# Patient Record
Sex: Female | Born: 1985 | Race: Asian | Hispanic: No | Marital: Single | Smoking: Never smoker
Health system: Southern US, Community
[De-identification: ages and names within clinical notes are randomized; demographics above are authoritative.]

## PROBLEM LIST (undated history)

## (undated) DIAGNOSIS — M069 Rheumatoid arthritis, unspecified: Secondary | ICD-10-CM

## (undated) DIAGNOSIS — F32A Depression, unspecified: Secondary | ICD-10-CM

## (undated) DIAGNOSIS — R51 Headache: Secondary | ICD-10-CM

## (undated) DIAGNOSIS — Z833 Family history of diabetes mellitus: Secondary | ICD-10-CM

## (undated) DIAGNOSIS — J302 Other seasonal allergic rhinitis: Secondary | ICD-10-CM

## (undated) DIAGNOSIS — T7840XA Allergy, unspecified, initial encounter: Secondary | ICD-10-CM

## (undated) DIAGNOSIS — I10 Essential (primary) hypertension: Secondary | ICD-10-CM

## (undated) DIAGNOSIS — G4700 Insomnia, unspecified: Secondary | ICD-10-CM

## (undated) DIAGNOSIS — D689 Coagulation defect, unspecified: Secondary | ICD-10-CM

## (undated) DIAGNOSIS — Z79899 Other long term (current) drug therapy: Secondary | ICD-10-CM

## (undated) DIAGNOSIS — N946 Dysmenorrhea, unspecified: Secondary | ICD-10-CM

## (undated) DIAGNOSIS — G8929 Other chronic pain: Secondary | ICD-10-CM

## (undated) DIAGNOSIS — C801 Malignant (primary) neoplasm, unspecified: Secondary | ICD-10-CM

## (undated) DIAGNOSIS — E669 Obesity, unspecified: Secondary | ICD-10-CM

## (undated) DIAGNOSIS — K219 Gastro-esophageal reflux disease without esophagitis: Secondary | ICD-10-CM

## (undated) DIAGNOSIS — R519 Headache, unspecified: Secondary | ICD-10-CM

## (undated) DIAGNOSIS — F411 Generalized anxiety disorder: Secondary | ICD-10-CM

## (undated) HISTORY — DX: Headache: R51

## (undated) HISTORY — DX: Headache, unspecified: R51.9

## (undated) HISTORY — DX: Obesity, unspecified: E66.9

## (undated) HISTORY — DX: Insomnia, unspecified: G47.00

## (undated) HISTORY — DX: Malignant (primary) neoplasm, unspecified: C80.1

## (undated) HISTORY — DX: Other seasonal allergic rhinitis: J30.2

## (undated) HISTORY — DX: Other long term (current) drug therapy: Z79.899

## (undated) HISTORY — DX: Essential (primary) hypertension: I10

## (undated) HISTORY — DX: Gastro-esophageal reflux disease without esophagitis: K21.9

## (undated) HISTORY — DX: Allergy, unspecified, initial encounter: T78.40XA

## (undated) HISTORY — DX: Other chronic pain: G89.29

## (undated) HISTORY — PX: NO PAST SURGERIES: SHX2092

## (undated) HISTORY — DX: Rheumatoid arthritis, unspecified: M06.9

## (undated) HISTORY — DX: Depression, unspecified: F32.A

## (undated) HISTORY — DX: Generalized anxiety disorder: F41.1

## (undated) HISTORY — DX: Family history of diabetes mellitus: Z83.3

## (undated) HISTORY — DX: Coagulation defect, unspecified: D68.9

## (undated) HISTORY — DX: Dysmenorrhea, unspecified: N94.6

---

## 2010-01-31 HISTORY — PX: ESOPHAGOGASTRODUODENOSCOPY: SHX1529

## 2014-02-10 ENCOUNTER — Telehealth: Payer: Self-pay | Admitting: Medical

## 2014-02-10 NOTE — Telephone Encounter (Signed)
Pt's therapist, Lauren, with [facility] called. She stated she would like to speak to you concerning this pt before she comes in. You can reach her at [phone].

## 2014-02-10 NOTE — Telephone Encounter (Signed)
I called and left message for Lauren to call me back.

## 2014-02-12 NOTE — Telephone Encounter (Signed)
Spoke to [facility] [employer] for Cognitive and Behavior Therapy.  She is referring this person to me.  Tamara Vega has been seeing this person for several months since last summer. She is currently on Lexapro 20mg , prescribed by her PCM in [place].  PCM prior was reluctant to going higher on Lexapro or other medication for anxiety.  Tamara Vega recommended adding Klonopin 0.5mg , 1/2 TID in general, but titrating Klonopin.  Close f/u with both her and me.

## 2014-02-15 ENCOUNTER — Ambulatory Visit (INDEPENDENT_AMBULATORY_CARE_PROVIDER_SITE_OTHER): Payer: BC Managed Care – PPO | Admitting: Medical

## 2014-02-15 ENCOUNTER — Encounter: Payer: Self-pay | Admitting: Medical

## 2014-02-15 VITALS — BP 130/82 | HR 92 | Temp 98.1°F | Resp 16 | Ht 62.0 in | Wt 184.0 lb

## 2014-02-15 DIAGNOSIS — G4700 Insomnia, unspecified: Secondary | ICD-10-CM

## 2014-02-15 DIAGNOSIS — J309 Allergic rhinitis, unspecified: Secondary | ICD-10-CM

## 2014-02-15 DIAGNOSIS — J069 Acute upper respiratory infection, unspecified: Secondary | ICD-10-CM

## 2014-02-15 DIAGNOSIS — F411 Generalized anxiety disorder: Secondary | ICD-10-CM

## 2014-02-15 MED ORDER — CLONAZEPAM 0.5 MG PO TABS: ORAL_TABLET | ORAL | Status: DC

## 2014-02-15 MED ORDER — MOMETASONE FUROATE 50 MCG/ACT NA SUSP: 2.0000 | Freq: Every day | NASAL | Status: DC

## 2014-02-15 MED ORDER — ESCITALOPRAM OXALATE 10 MG PO TABS: 10.0000 mg | ORAL_TABLET | Freq: Every day | ORAL | Status: DC

## 2014-02-15 NOTE — Progress Notes (Signed)
   Subjective:   Tamara Vega is a 28 y.o. female presenting on 02/15/2014 with Insomnia  Referred here by Herby Abraham at [facility] for Cognitive and Behavioral Therapy.  Been seeing Lauren since last August 2014 for anxiety.  A lot of her anxiety is related to difficulty with sleep.  Takes Lexapro 10mg  daily, been stable on this.   Her previous PCM was prescribing the Lexapro but was resistant to any other medication changes.  Anxiety and sleep issues started late last year, came out of a bad relationship.  She struggles with insomnia which is why she was referred here.  Sleeps 2 hours per night on average.  Has trouble getting and staying asleep.  Has hx/o anxiety.  Has tried numerous things.  Has done meditation, hot showers, tea, yoga, reading in the evening.  Has tried so many things, frustrated not getting rest, exhausted.  Has tried Zquil.   The last few nights using Benadryl for head cold, slept a little better over the weekend. Has never been on any prescription sleep medication.  Lives alone, has a dog.   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  Denies drinking a lots of  Caffeine.  Drinks 1 cup of coffee daily in the morning.  Stopped soda.  Occasional drinks tea.  No caffeine after noon.  She exercises 3 times per week.   Sees Lauren once weekly for counseling.  She has sister in [place], parents in [place].    She has 4 day hx/o nasal congestion, headache, mild sore throat, LGF, mild cough, but no ear pain, chest congestion.  She does have hx/o sinus infections.  Hx/o allergies, takes Allegra daily.  Was on Nasacort prior but gave her nosebleeds.  No other complaint.  Review of Systems ROS as in subjective      Objective:     Filed Vitals:   02/15/14 1347  BP: 130/82  Pulse: 92  Temp: 98.1 F (36.7 C)  Resp: 16    General appearance: alert, no distress, WD/WN HEENT: normocephalic, sclerae anicteric, TMs pearly, nares bilat turbinate edema, clear discharge,  pharynx normal Oral cavity: MMM, no lesions Neck: supple, no lymphadenopathy, no thyromegaly, no masses Heart: RRR, normal S1, S2, no murmurs Lungs: CTA bilaterally, no wheezes, rhonchi, or rales Pulses: 2+ symmetric Psych: pleasant, good eye contact, answers questions appropriately     Assessment: Encounter Diagnoses  Name Primary?  . Insomnia Yes  . Generalized anxiety disorder   . Upper respiratory infection   . Allergic rhinitis      Plan: Insomnia - discussed prior treatment, sleep hygiene, and at request of her counselor, begin trial of Klonopin for both sleep and anxiety.   discussed risks/benefits, proper use.  Call if any reason in the meantime, otherwise f/u 2-3 wk.  Anxiety - c/t counseling, Lexapro 10mg  daily at bedtime  URI - discussed supportive care, sample of Nasonex given, discussed proper use, f/u if not improving.  Allergic rhinitis - c/t Allegra daily QHS  Tamara Vega was seen today for insomnia.  Diagnoses and associated orders for this visit:  Insomnia  Generalized anxiety disorder  Upper respiratory infection  Allergic rhinitis  Other Orders - clonazePAM (KLONOPIN) 0.5 MG tablet; 1/2 tablet BID, 1/2 - 1 tablet QHS - escitalopram (LEXAPRO) 10 MG tablet; Take 1 tablet (10 mg total) by mouth daily. - mometasone (NASONEX) 50 MCG/ACT nasal spray; Place 2 sprays into the nose daily.     Return 2-3 wk.

## 2014-02-15 NOTE — Patient Instructions (Signed)
Thank you for giving me the opportunity to serve you today.    Your diagnosis today includes: Encounter Diagnoses  Name Primary?  . Insomnia Yes  . Generalized anxiety disorder   . Upper respiratory infection   . Allergic rhinitis      Specific recommendations today include:  Begin Klonopin 0.5mg , 1/2-1 tablet at bedtime for insomnia  You may also use the Klonopin 1/2 tablet up to twice daily during the day time as needed for anxiety  Continue Lexapro 10mg  daily  Continue your routine counseling appointments with Lauren  Begin Nasonex 1 spray per nostril twice daily for nasal congestion using the sample.  As an alternate, you may use OTC Afrin at night time for 4 days or less for nasal congestion  Continue Allegra as usual  You can continue nasal saline spray or flush  Rest, drink plenty of water, and if not much improved by end of the week, let me know  Follow up: in 2-3 weeks   I have included other useful information below for your review.  Insomnia Insomnia is frequent trouble falling and/or staying asleep. Insomnia can be a long term problem or a short term problem. Both are common. Insomnia can be a short term problem when the wakefulness is related to a certain stress or worry. Long term insomnia is often related to ongoing stress during waking hours and/or poor sleeping habits. Overtime, sleep deprivation itself can make the problem worse. Every little thing feels more severe because you are overtired and your ability to cope is decreased. CAUSES   Stress, anxiety, and depression.  Poor sleeping habits.  Distractions such as TV in the bedroom.  Naps close to bedtime.  Engaging in emotionally charged conversations before bed.  Technical reading before sleep.  Alcohol and other sedatives. They may make the problem worse. They can hurt normal sleep patterns and normal dream activity.  Stimulants such as caffeine for several hours prior to bedtime.  Pain  syndromes and shortness of breath can cause insomnia.  Exercise late at night.  Changing time zones may cause sleeping problems (jet lag). It is sometimes helpful to have someone observe your sleeping patterns. They should look for periods of not breathing during the night (sleep apnea). They should also look to see how long those periods last. If you live alone or observers are uncertain, you can also be observed at a sleep clinic where your sleep patterns will be professionally monitored. Sleep apnea requires a checkup and treatment. Give your caregivers your medical history. Give your caregivers observations your family has made about your sleep.  SYMPTOMS   Not feeling rested in the morning.  Anxiety and restlessness at bedtime.  Difficulty falling and staying asleep. TREATMENT   Your caregiver may prescribe treatment for an underlying medical disorders. Your caregiver can give advice or help if you are using alcohol or other drugs for self-medication. Treatment of underlying problems will usually eliminate insomnia problems.  Medications can be prescribed for short time use. They are generally not recommended for lengthy use.  Over-the-counter sleep medicines are not recommended for lengthy use. They can be habit forming.  You can promote easier sleeping by making lifestyle changes such as:  Using relaxation techniques that help with breathing and reduce muscle tension.  Exercising earlier in the day.  Changing your diet and the time of your last meal. No night time snacks.  Establish a regular time to go to bed.  Counseling can help with stressful problems  and worry.  Soothing music and white noise may be helpful if there are background noises you cannot remove.  Stop tedious detailed work at least one hour before bedtime. HOME CARE INSTRUCTIONS   Keep a diary. Inform your caregiver about your progress. This includes any medication side effects. See your caregiver  regularly. Take note of:  Times when you are asleep.  Times when you are awake during the night.  The quality of your sleep.  How you feel the next day. This information will help your caregiver care for you.  Get out of bed if you are still awake after 15 minutes. Read or do some quiet activity. Keep the lights down. Wait until you feel sleepy and go back to bed.  Keep regular sleeping and waking hours. Avoid naps.  Exercise regularly.  Avoid distractions at bedtime. Distractions include watching television or engaging in any intense or detailed activity like attempting to balance the household checkbook.  Develop a bedtime ritual. Keep a familiar routine of bathing, brushing your teeth, climbing into bed at the same time each night, listening to soothing music. Routines increase the success of falling to sleep faster.  Use relaxation techniques. This can be using breathing and muscle tension release routines. It can also include visualizing peaceful scenes. You can also help control troubling or intruding thoughts by keeping your mind occupied with boring or repetitive thoughts like the old concept of counting sheep. You can make it more creative like imagining planting one beautiful flower after another in your backyard garden.  During your day, work to eliminate stress. When this is not possible use some of the previous suggestions to help reduce the anxiety that accompanies stressful situations. MAKE SURE YOU:   Understand these instructions.  Will watch your condition.  Will get help right away if you are not doing well or get worse. Document Released: 11/16/2000 Document Revised: 02/11/2012 Document Reviewed: 12/17/2007 [facility] Patient Information 2014 [place].

## 2014-03-15 ENCOUNTER — Encounter: Payer: Self-pay | Admitting: Medical

## 2014-03-15 ENCOUNTER — Ambulatory Visit (INDEPENDENT_AMBULATORY_CARE_PROVIDER_SITE_OTHER): Payer: BC Managed Care – PPO | Admitting: Medical

## 2014-03-15 ENCOUNTER — Ambulatory Visit: Payer: BC Managed Care – PPO | Admitting: Medical

## 2014-03-15 VITALS — BP 100/80 | HR 80 | Temp 98.2°F | Resp 16 | Wt 187.0 lb

## 2014-03-15 DIAGNOSIS — G4700 Insomnia, unspecified: Secondary | ICD-10-CM

## 2014-03-15 DIAGNOSIS — F411 Generalized anxiety disorder: Secondary | ICD-10-CM

## 2014-03-15 MED ORDER — TRAZODONE HCL 50 MG PO TABS: 25.0000 mg | ORAL_TABLET | Freq: Every evening | ORAL | Status: DC | PRN

## 2014-03-15 MED ORDER — ESCITALOPRAM OXALATE 20 MG PO TABS
20.0000 mg | ORAL_TABLET | Freq: Every day | ORAL | Status: DC
Start: 2014-03-15 — End: 2014-11-23

## 2014-03-15 NOTE — Patient Instructions (Signed)
Insomnia, Anxiety   Increase to Lexapro 20mg  daily.  At bedtime, begin Trazodone 25mg  or 1/2 tablet of the Trazodone 50mg  each night time.    For now, continue using klonopin 1/2 tablet in the morning, and 1/2-1 tablet if needed at bedtime.    Try and given the Trazodone 25mg  at least 7-10 days to see how this will work.   After 10 days, if no major improvement, you can use 50mg  Trazodone at bedtime.  After begin on this regimen 2-3 weeks, call back and give me update on sleep.

## 2014-03-15 NOTE — Progress Notes (Signed)
Subjective:   Tamara Vega is a 28 y.o. female presenting on 03/15/2014 with Follow-up  Referred here by Tamara Vega at [facility] for Cognitive and Behavioral Therapy.  Been seeing Tamara Vega since last August 2014 for anxiety.  A lot of her anxiety is related to difficulty with sleep.  Takes Lexapro 10mg  daily, been stable on this.   Anxiety and sleep issues started late last year, came out of a bad relationship.    Since last visit was taking 1/2 tablet of Klonopin in the morning and 1 tablet at bedtime.  1/2 in the morning really helps her anxiety.  Taking 1 at bedtime helps her get to sleep, has improved since last visit but still only getting 4-6 hours of sleep nightly.     She struggles with insomnia which is why she was referred here.  Prior to last visit was sleeping 2 hours per night on average.  Has trouble getting and staying asleep.  Has hx/o anxiety.  Has tried numerous things.  Has done meditation, hot showers, tea, yoga, reading in the evening.  Has tried so many things, frustrated not getting rest, exhausted.  Has tried Zquil.   Lives alone, has a dog.   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  Denies drinking a lots of  Caffeine.  Drinks 1 cup of coffee daily in the morning.  Stopped soda.  Occasional drinks tea.  No caffeine after noon.  She exercises 3 times per week.   Sees Tamara Vega once weekly for counseling.  She has sister in [place], parents in [place].    No other complaint.  Review of Systems ROS as in subjective      Objective:     Filed Vitals:   03/15/14 0828  BP: 100/80  Pulse: 80  Temp: 98.2 F (36.8 C)  Resp: 16    General appearance: alert, no distress, WD/WN Psych: pleasant, good eye contact, answers questions appropriately     Assessment: Encounter Diagnoses  Name Primary?  . Insomnia Yes  . Generalized anxiety disorder      Plan: Insomnia - discussed prior treatment, sleep hygiene. Discussed options for therapy.  She did  have improvement with Klonopin but not quite as desired.  Begin Trazodone 25mg  QHS, hold off on Klonopin QHS for now.  Discussed risks/benefits, proper use.  Call if any reason in the meantime, otherwise f/u 2-3 wk.  Anxiety - c/t counseling, we discussed her concerns, recent counseling visit with Tamara Vega, increase Lexapro 20mg  daily at bedtime.   We will have to monitor for weight gain.   Tamara Vega was seen today for follow-up.  Diagnoses and associated orders for this visit:  Insomnia  Generalized anxiety disorder  Other Orders - escitalopram (LEXAPRO) 20 MG tablet; Take 1 tablet (20 mg total) by mouth at bedtime. - traZODone (DESYREL) 50 MG tablet; Take 0.5-1 tablets (25-50 mg total) by mouth at bedtime as needed for sleep.     Return call back in 2-3 wk.

## 2014-04-29 ENCOUNTER — Other Ambulatory Visit: Payer: Self-pay | Admitting: Medical

## 2014-04-29 NOTE — Telephone Encounter (Signed)
Called Rx to pharmacy

## 2014-04-29 NOTE — Telephone Encounter (Signed)
Called Klonopin generic to pharmacy per Audelia Acton.

## 2014-04-29 NOTE — Telephone Encounter (Signed)
Is this okay to refill? 

## 2014-04-29 NOTE — Telephone Encounter (Signed)
LM to CB

## 2014-04-29 NOTE — Telephone Encounter (Signed)
pls call and see how she is doing.  How is the Trazodone working for sleep?  Is she taking it daily?  Is she doing better on the higher dose of Lexapro?  How often is she using the [place]?

## 2014-05-20 ENCOUNTER — Ambulatory Visit: Payer: BC Managed Care – PPO | Admitting: Medical

## 2014-05-24 ENCOUNTER — Ambulatory Visit (INDEPENDENT_AMBULATORY_CARE_PROVIDER_SITE_OTHER): Payer: BC Managed Care – PPO | Admitting: Medical

## 2014-05-24 ENCOUNTER — Encounter: Payer: Self-pay | Admitting: Medical

## 2014-05-24 VITALS — BP 130/80 | HR 78 | Temp 98.2°F | Resp 14 | Wt 194.0 lb

## 2014-05-24 DIAGNOSIS — G4700 Insomnia, unspecified: Secondary | ICD-10-CM

## 2014-05-24 DIAGNOSIS — F411 Generalized anxiety disorder: Secondary | ICD-10-CM

## 2014-05-24 DIAGNOSIS — R635 Abnormal weight gain: Secondary | ICD-10-CM

## 2014-05-24 MED ORDER — CITALOPRAM HYDROBROMIDE 20 MG PO TABS: 20.0000 mg | ORAL_TABLET | Freq: Every day | ORAL | Status: DC

## 2014-05-24 MED ORDER — PHENTERMINE HCL 37.5 MG PO TABS: 37.5000 mg | ORAL_TABLET | Freq: Every day | ORAL | Status: DC

## 2014-05-24 NOTE — Progress Notes (Signed)
Subjective:   Tamara Vega is a 28 y.o. female presenting on 05/24/2014 with Follow-up  Insomnia - taking Trazodone regularly with good results, relatively speaking.  Was getting 2 hours per night sleep, but now getting at least 4 hours per night of sleep.    Anxiety - Uses Clonazepam intermittently, but not frequently for anxiety/panic, not using this for sleep.  Overall has done well on lexapro, but since we increased to 20mg , she had gained 10 lb, since starting Lexapro in late 2014 by prior PCM, has gained overall 30 lb since starting Lexapro which has congributed to her anxiety.  Still seeing Herby Abraham at [facility] for Cognitive and Behavioral Therapy.  Been seeing Lauren since last August 2014 for anxiety.  A lot of her anxiety is related to difficulty with sleep.   Her sister is getting married this week, so has lots of stressors this week.   No other complaint.  Review of Systems ROS as in subjective      Objective:     Filed Vitals:   05/24/14 1044  BP: 130/80  Pulse: 78  Temp: 98.2 F (36.8 C)  Resp: 14    General appearance: alert, no distress, WD/WN Psych: pleasant, good eye contact, answers questions appropriately     Assessment: Encounter Diagnoses  Name Primary?  . Generalized anxiety disorder Yes  . Insomnia   . Weight gain      Plan: Insomnia - c/t Trazodone 50mg  QHS, c/t good sleep hygiene.  Anxiety - c/t counseling, we discussed her concerns, change from Lexapro to Citalopram due to weight gain with Lexapro.  Weight gain - after significant weight gain on lexapro, change off Lexapro.  C/t routine exercise and careful diet which she is doing.  Can begin Phentermine.  discussed risks/benefits of medication.    Bently was seen today for follow-up.  Diagnoses and associated orders for this visit:  Generalized anxiety disorder  Insomnia  Weight gain  Other Orders - citalopram (CELEXA) 20 MG tablet; Take 1 tablet (20 mg total) by mouth  daily. - phentermine (ADIPEX-P) 37.5 MG tablet; Take 1 tablet (37.5 mg total) by mouth daily before breakfast.     Return call report 1-2 wk.

## 2014-05-24 NOTE — Patient Instructions (Signed)
Recommendations:  Lets STOP Lexapro, and change to Citalopram/Celexa.   Begin Celexa/Citalopram 20mg  at bedtime tonight.  Continue Trazodone as you have been doing.  Use Clonazepam if needed.  If any side effects, problem, then call right away.  Monitor your weight closely for the next few weeks and keep me informed.  At your discretion - begin Phentermine weight loss medication.  This is an appetite suppressant taken in the morning.   Common side effects of Phentermine include insomnia and headaches, but many people do fine without side effects.    Lets plan follow up in 1 months, sooner by phone or in person if problems.   Check on insurance coverage and pricing for Qysmia weight loss medication.

## 2014-07-12 ENCOUNTER — Telehealth: Payer: Self-pay | Admitting: Internal Medicine

## 2014-07-12 NOTE — Telephone Encounter (Signed)
Pt is requesting her citalopram to be changed from 20mg  to 40mg  to [facility]

## 2014-07-13 ENCOUNTER — Other Ambulatory Visit: Payer: Self-pay | Admitting: Medical

## 2014-07-13 MED ORDER — CITALOPRAM HYDROBROMIDE 40 MG PO TABS: 40.0000 mg | ORAL_TABLET | Freq: Every day | ORAL | Status: DC

## 2014-07-18 ENCOUNTER — Other Ambulatory Visit: Payer: Self-pay | Admitting: Medical

## 2014-07-19 NOTE — Telephone Encounter (Signed)
Is this okay to refill? 

## 2014-07-19 NOTE — Telephone Encounter (Signed)
Can refill both x 1

## 2014-07-19 NOTE — Telephone Encounter (Signed)
[facility] [place] [address]. Fransisco Beau Trazodone HCI 50 mg

## 2014-07-19 NOTE — Telephone Encounter (Signed)
Called med out to pharmacy  

## 2014-08-06 ENCOUNTER — Other Ambulatory Visit: Payer: Self-pay | Admitting: Medical

## 2014-08-06 MED ORDER — CITALOPRAM HYDROBROMIDE 40 MG PO TABS: 40.0000 mg | ORAL_TABLET | Freq: Every day | ORAL | Status: DC

## 2014-08-06 NOTE — Telephone Encounter (Signed)
rx sent

## 2014-08-06 NOTE — Telephone Encounter (Signed)
I am worried that if we go up on the dose, she will get the weight gain issue again.  Is the current dose not helping as much as she would like? What is her current weight?  She may want to come in and discuss other medication options.     Let me know.

## 2014-08-06 NOTE — Telephone Encounter (Signed)
Pt states that she is doing 20mg  @ 1.5 tablets to equal her at 30mg . She states the 40mg  was to strong and then 20mg  was not working. Pt talked to her therapist and they agreed that it would be best for ehr to be at 30mg . Her current weight is 179.5. Please send in 30mg  to [facility].

## 2014-08-06 NOTE — Telephone Encounter (Signed)
Is this okay to refill? 

## 2014-10-03 DIAGNOSIS — M069 Rheumatoid arthritis, unspecified: Secondary | ICD-10-CM

## 2014-10-03 HISTORY — DX: Rheumatoid arthritis, unspecified: M06.9

## 2014-10-19 ENCOUNTER — Telehealth: Payer: Self-pay | Admitting: Internal Medicine

## 2014-10-19 NOTE — Telephone Encounter (Signed)
Refill request for phentermine 37.5mg  #[facility]

## 2014-10-19 NOTE — Telephone Encounter (Signed)
Call out 1 mo and plan f/u visit

## 2014-10-20 ENCOUNTER — Other Ambulatory Visit: Payer: Self-pay | Admitting: Family Medicine

## 2014-10-20 MED ORDER — PHENTERMINE HCL 37.5 MG PO TABS: 37.5000 mg | ORAL_TABLET | Freq: Every day | ORAL | Status: DC

## 2014-10-20 NOTE — Telephone Encounter (Signed)
I called out phentermine per Chana Bode [facility] and patient is aware to schedule a follow up visit

## 2014-11-03 ENCOUNTER — Telehealth: Payer: Self-pay | Admitting: Medical

## 2014-11-03 NOTE — Telephone Encounter (Signed)
Please call and try to find out some of her questions.  Last visit was in June so she is due for a visit though.  Please schedule f/u

## 2014-11-03 NOTE — Telephone Encounter (Signed)
Lauren would like to speak to you concerning Tamara Vega  Her # [id number] and she will available until around 4:00

## 2014-11-23 ENCOUNTER — Ambulatory Visit (INDEPENDENT_AMBULATORY_CARE_PROVIDER_SITE_OTHER): Payer: 59 | Admitting: Medical

## 2014-11-23 ENCOUNTER — Encounter: Payer: Self-pay | Admitting: Medical

## 2014-11-23 VITALS — BP 138/82 | HR 88 | Temp 98.5°F | Resp 16 | Wt 182.0 lb

## 2014-11-23 DIAGNOSIS — G4700 Insomnia, unspecified: Secondary | ICD-10-CM

## 2014-11-23 DIAGNOSIS — Z638 Other specified problems related to primary support group: Secondary | ICD-10-CM

## 2014-11-23 DIAGNOSIS — E669 Obesity, unspecified: Secondary | ICD-10-CM

## 2014-11-23 DIAGNOSIS — Z566 Other physical and mental strain related to work: Secondary | ICD-10-CM

## 2014-11-23 DIAGNOSIS — F411 Generalized anxiety disorder: Secondary | ICD-10-CM

## 2014-11-23 DIAGNOSIS — M2550 Pain in unspecified joint: Secondary | ICD-10-CM

## 2014-11-23 MED ORDER — VENLAFAXINE HCL ER 37.5 MG PO CP24: 37.5000 mg | ORAL_CAPSULE | Freq: Every day | ORAL | Status: DC

## 2014-11-23 MED ORDER — ALPRAZOLAM 0.5 MG PO TABS: 0.5000 mg | ORAL_TABLET | Freq: Every evening | ORAL | Status: DC | PRN

## 2014-11-23 MED ORDER — TRAZODONE HCL 50 MG PO TABS: 50.0000 mg | ORAL_TABLET | Freq: Every day | ORAL | Status: DC

## 2014-11-23 NOTE — Patient Instructions (Signed)
We are making a couple changes today:   For the next 7 days, start taking 1/2 tablet of Celexa daily, and after 7 days, STOP Celexa  By Friday, begin Effexor 37.5mg  XR once daily  By the middle of next week, you will be OFF Celexa, and on Effexor for anxiety  You can continue Trazodone for sleep, but try and use lower dose while we make this medication change to Effexor  We will also transition OFF Clonazepam, and instead use Xanax when needed for panic feeling, anxiety.     Xanax is shorter acting, and should work fine when needed  Call me back by next Friday around your birthday to let me know how things are doing  Call sooner if needed

## 2014-11-23 NOTE — Progress Notes (Signed)
Subjective: Here for follow-up on medications. She was originally referred here by Herby Abraham at [facility] for Cognitive and Behavioral Therapy.  In recent months she has had an increase in anxiety, medications not seeming to work as well. 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  Lately she has been more withdrawn, doesn't care about hanging out with friends or doing other social things. She is single, has a dog, and at times her employer probably takes advantage of the fact that she is single, giving her more work.  There are also unreasonable demands on her from her parents. She is the last one of her siblings that is not married.  Her father is a Teacher, music, her sister is a Software engineer, her brother is a Psychologist, sport and exercise.  Her mother raised the family.  Lately stress is exhibited with hair falling out too.  Originally she came to me with insomnia issues and overall has done pretty well with trazodone. Gets about 6 hours of sleep nightly compared to the initial 4 she was getting  She had lost back down to her usual weight after initial gain weight with the Celexa  She is still taking Celexa 40 mg daily, was on Lexapro prior. On average takes 3-4 Klonopin per month.  Takes 1/4-1/2 half a trazodone tablet nightly  She still occasionally sees her prior primary care provider back in [place] in [place]. She saw him several weeks ago with complaints of bilateral wrist swelling and pain, ankle pain, knee pain, and carpal tunnel like symptoms but apparently had abnormal rheumatoid screening labs. She has a referral and will be seeing rheumatology, Dr. Lenna Gilford in January  ROS as in subjective  Objective: BP 138/82 mmHg  Pulse 88  Temp(Src) 98.5 F (36.9 C) (Oral)  Resp 16  Wt 182 lb (82.555 kg)  Gen: wd, wn, nad Psych: pleasant, answers questions appropriately, good eye contact  Assessment:  Encounter Diagnoses  Name  Primary?  . Generalized anxiety disorder Yes  . Insomnia   . Work-related stress   . Stress due to family tension   . Obesity   . Polyarthralgia    Plan: We discussed her concerns and recent issues. She will continue counseling with Lauren. We will make a change off Celexa home to Effexor as discussed. Will replace clonazepam with Xanax for shorter acting medication.  She will continue trazodone which she has done fairly well with.   She will call within a week let me know how the transition is going. We discussed setting boundaries both with personal family life as well as work life.  Obesity-she continues to work on diet and exercise.  Polyarthralgia- she will have rheumatology copy me on the labs and evaluation  Patient Instructions  We are making a couple changes today:   For the next 7 days, start taking 1/2 tablet of Celexa daily, and after 7 days, STOP Celexa  By Friday, begin Effexor 37.5mg  XR once daily  By the middle of next week, you will be OFF Celexa, and on Effexor for anxiety  You can continue Trazodone for sleep, but try and use lower dose while we make this medication change to Effexor  We will also transition OFF Clonazepam, and instead use Xanax when needed for panic feeling, anxiety.     Xanax is shorter acting, and should work fine when needed  Call me back by next Friday around your birthday to let me  know how things are doing  Call sooner if needed

## 2014-12-17 ENCOUNTER — Other Ambulatory Visit: Payer: Self-pay | Admitting: Medical

## 2014-12-17 ENCOUNTER — Telehealth: Payer: Self-pay | Admitting: Medical

## 2014-12-17 MED ORDER — VENLAFAXINE HCL ER 75 MG PO CP24: 75.0000 mg | ORAL_CAPSULE | Freq: Every day | ORAL | Status: DC

## 2014-12-17 NOTE — Telephone Encounter (Signed)
i sent Effexor 75mg  XR daily.  This is an increased dose.   Glad to hear it is helping.  F/u 49mo

## 2014-12-17 NOTE — Telephone Encounter (Signed)
Pt called with an update for Tamara Vega. She switched to Effexor after 10 days anxiety has improved a lot. The patient and her therapist think that the Effexor dosage does need to be increased to further help.

## 2014-12-17 NOTE — Telephone Encounter (Signed)
Left detailed message on pt vm.  

## 2014-12-22 ENCOUNTER — Telehealth: Payer: Self-pay | Admitting: Medical

## 2014-12-22 NOTE — Telephone Encounter (Signed)
Call out phentermine and f/u mid February on both Effexor and phentermine

## 2014-12-23 ENCOUNTER — Other Ambulatory Visit: Payer: Self-pay

## 2014-12-23 NOTE — Telephone Encounter (Signed)
Called Phentermine 37.5mg  #30 1 po q am, and Effexor XR 75mg  #30, 1 po q am to [facility] per Audelia Acton.

## 2014-12-23 NOTE — Telephone Encounter (Signed)
Done

## 2015-01-20 ENCOUNTER — Encounter: Payer: Self-pay | Admitting: *Deleted

## 2015-01-21 ENCOUNTER — Ambulatory Visit (INDEPENDENT_AMBULATORY_CARE_PROVIDER_SITE_OTHER): Payer: 59 | Admitting: Neurology

## 2015-01-21 ENCOUNTER — Other Ambulatory Visit: Payer: Self-pay | Admitting: *Deleted

## 2015-01-21 DIAGNOSIS — R208 Other disturbances of skin sensation: Secondary | ICD-10-CM

## 2015-01-21 DIAGNOSIS — R200 Anesthesia of skin: Secondary | ICD-10-CM

## 2015-01-21 NOTE — Procedures (Signed)
[facility] Neurology  [facility], [place]  [place], [place] [address] Tel: [phone] Fax:  [phone] Test Date:  01/21/2015  Patient: Tamara Vega DOB: 03-16-1986 Physician: Narda Amber, DO  Sex: Female Height: 5' 2.75" Ref Phys: Gavin Pound  ID#: [id number] Temp: 37.3C Technician: Laureen Ochs R. NCS T.   Patient Complaints: Patient is a 29 year old female here for evaluation of numbness, tingling and pain in both hands left worse than right.  NCV & EMG Findings: Extensive electrodiagnostic testing of the left upper extremity and additional studies of the right shows: 1. Left median sensory response shows prolonged latency with preserved amplitude. The remaining sensory studies including the right median, right palmar, bilateral radial and ulnar sensory nerves are within normal limits. 2. Bilateral median and ulnar motor responses are within normal limits. 3. There is no evidence of active or chronic motor axon loss changes affecting any of the tested muscles.   Impression: 1. Left median neuropathy, at or distal to the wrist, consistent with the clinical diagnosis of carpal tunnel syndrome; mild in degree electrically. 2. There is no evidence of a cervical radiculopathy affecting the upper extremities.  ___________________________ Narda Amber, DO    Nerve Conduction Studies Anti Sensory Summary Table   Site NR Peak (ms) Norm Peak (ms) P-T Amp (V) Norm P-T Amp  Left Median Anti Sensory (2nd Digit)  Wrist    3.6 <3.4 42.1 >20  Right Median Anti Sensory (2nd Digit)  38C  Wrist    3.1 <3.4 50.9 >20  Left Radial Anti Sensory (Base 1st Digit)  Wrist    2.1 <2.7 39.3 >18  Right Radial Anti Sensory (Base 1st Digit)  38C  Wrist    1.9 <2.7 39.0 >18  Left Ulnar Anti Sensory (5th Digit)  Wrist    2.7 <3.1 39.3 >12  Right Ulnar Anti Sensory (5th Digit)  38C  Wrist    2.6 <3.1 31.6 >12   Motor Summary Table   Site NR Onset (ms) Norm Onset (ms) O-P Amp (mV)  Norm O-P Amp Site1 Site2 Delta-0 (ms) Dist (cm) Vel (m/s) Norm Vel (m/s)  Left Median Motor (Abd Poll Brev)  Wrist    3.5 <3.9 11.7 >6 Elbow Wrist 4.6 25.0 54 >50  Elbow    8.1  11.7         Right Median Motor (Abd Poll Brev)  38C  Wrist    2.5 <3.9 9.9 >6 Elbow Wrist 4.3 26.0 60 >50  Elbow    6.8  9.6         Left Ulnar Motor (Abd Dig Minimi)  Wrist    1.6 <3.1 9.6 >7 B Elbow Wrist 3.6 21.0 58 >50  B Elbow    5.2  9.1  A Elbow B Elbow 1.7 10.0 59 >50  A Elbow    6.9  8.7         Right Ulnar Motor (Abd Dig Minimi)  38C  Wrist    1.7 <3.1 8.2 >7 B Elbow Wrist 3.5 22.0 63 >50  B Elbow    5.2  8.2  A Elbow B Elbow 1.8 10.0 56 >50  A Elbow    7.0  8.2          Comparison Summary Table   Site NR Peak (ms) Norm Peak (ms) P-T Amp (V) Site1 Site2 Delta-P (ms) Norm Delta (ms)  Right Median/Ulnar Palm Comparison (Wrist - 8cm)  38C  Median Palm    2.0 <  2.2 63.0 Median Palm Ulnar Palm 0.2   Ulnar Palm    1.8 <2.2 27.3       EMG   Side Muscle Ins Act Fibs Psw Fasc Number Recrt Dur Dur. Amp Amp. Poly Poly. Comment  Right 1stDorInt Nml Nml Nml Nml Nml Nml Nml Nml Nml Nml Nml Nml N/A  Right Ext Indicis Nml Nml Nml Nml Nml Nml Nml Nml Nml Nml Nml Nml N/A  Right PronatorTeres Nml Nml Nml Nml Nml Nml Nml Nml Nml Nml Nml Nml N/A  Right Biceps Nml Nml Nml Nml Nml Nml Nml Nml Nml Nml Nml Nml N/A  Right Triceps Nml Nml Nml Nml Nml Nml Nml Nml Nml Nml Nml Nml N/A  Right Deltoid Nml Nml Nml Nml Nml Nml Nml Nml Nml Nml Nml Nml N/A  Left 1stDorInt Nml Nml Nml Nml Nml Nml Nml Nml Nml Nml Nml Nml N/A  Left Ext Indicis Nml Nml Nml Nml Nml Nml Nml Nml Nml Nml Nml Nml N/A  Left PronatorTeres Nml Nml Nml Nml Nml Nml Nml Nml Nml Nml Nml Nml N/A  Left Abd Poll Brev Nml Nml Nml Nml Nml Nml Nml Nml Nml Nml Nml Nml N/A  Left Biceps Nml Nml Nml Nml Nml Nml Nml Nml Nml Nml Nml Nml N/A  Left Deltoid Nml Nml Nml Nml Nml Nml Nml Nml Nml Nml Nml Nml N/A      Waveforms:

## 2015-01-26 ENCOUNTER — Encounter: Payer: Self-pay | Admitting: Medical

## 2015-01-26 ENCOUNTER — Ambulatory Visit (INDEPENDENT_AMBULATORY_CARE_PROVIDER_SITE_OTHER): Payer: 59 | Admitting: Medical

## 2015-01-26 VITALS — BP 122/90 | HR 105 | Temp 98.1°F | Resp 14 | Wt 179.0 lb

## 2015-01-26 DIAGNOSIS — R509 Fever, unspecified: Secondary | ICD-10-CM

## 2015-01-26 DIAGNOSIS — R52 Pain, unspecified: Secondary | ICD-10-CM

## 2015-01-26 DIAGNOSIS — R112 Nausea with vomiting, unspecified: Secondary | ICD-10-CM

## 2015-01-26 LAB — POC INFLUENZA A&B (BINAX/QUICKVUE)
INFLUENZA B, POC: NEGATIVE
Influenza A, POC: NEGATIVE

## 2015-01-26 MED ORDER — PROMETHAZINE HCL 25 MG PO TABS
25.0000 mg | ORAL_TABLET | Freq: Three times a day (TID) | ORAL | Status: DC | PRN
Start: 2015-01-26 — End: 2015-11-25

## 2015-01-26 NOTE — Progress Notes (Signed)
Subjective:  Tamara Vega is a 29 y.o. female who presents for illness.  She is accompanied by her mother. Her symptoms started abruptly 3 days ago with nausea, vomiting, fever, chills, sweats, weak, lightheaded, body aches, cough, stuffy nose, sore throat. She had nausea and vomiting all day Monday, Zofran eventually help that evening. She notes some low back pain some crampy belly pain. No urinary symptoms no rash. She has continued with the symptoms daily. There are 5 people out at her office sick. She did get a flu shot this year. Of note she had EMG testing last week for carpal tunnel symptoms. She is seeing rheumatology for possible rheumatoid arthritis but no recent change in her joint symptoms.  no other aggravating or relieving factors.  No other c/o.  The following portions of the patient's history were reviewed and updated as appropriate: allergies, current medications, past medical history, past social history and problem list.  ROS as in subjective   Past Medical History  Diagnosis Date  . Seasonal allergies     Objective: BP 122/90 mmHg  Pulse 105  Temp(Src) 98.1 F (36.7 C) (Oral)  Resp 14  Wt 179 lb (81.194 kg)  SpO2 99%  General: Ill-appearing, well-developed, well-nourished Skin: Hot, dry Oral: MMM, no lesions HEENT: Nose inflamed and congested, clear conjunctiva, TMs pearly, no sinus tenderness, pharynx with erythema, no exudates Neck: Supple, nontender, shotty cervical adenopathy Heart: Regular rate and rhythm, normal S1, S2, no murmurs Lungs: Clear to auscultation bilaterally, no wheezes, rales, rhonchi Abdomen: generalized mild tenderness, non distended, no mass, no organomegaly Back: mild low back tenderness in general Extremities: Mild generalized tenderness   Assessment and Plan: Encounter Diagnoses  Name Primary?  . Fever and chills Yes  . Nausea and vomiting, vomiting of unspecified type   . Body aches    Offered labs, chest xray to help evaluate.   She decline additional testing.  Clinically given symptoms, recent numerous flu cases in the community and sick contacts at work, despite negative flu test, her symptoms are suggestive of viral syndrome/flu like illness.  Discussed diagnosis of influenza.   Discussed supportive care including rest, hydration, OTC Tylenol or NSAID for fever, aches, and malaise.  Discussed period of contagion, self quarantine at home away from others to avoid spread of disease, discussed means of transmission, and possible complications including pneumonia.  Begin Promethazine for nausea if zofran not helping.   If worse or not improving within the next 4-5 days, then call or return.  Gave note for work.

## 2015-01-26 NOTE — Patient Instructions (Signed)
You symptoms suggest flu like illness  Recommendations:  Hydrate well throughout the day, water, gingerale, soup broth, Gatorade, ice chips  Bland foods/small portions such as crackers, rice, soup, jello, applesauce, bananas, etc  You may use Promethazine for nausea, 1/2- 1 tablet every 6 hours  For mild nausea you can use Zofran instead  For fever, aches, chills, you can use liquid OTC Ibuprofen, up to 600mg  every 6 hours  If worse in the next 48 hours such as wheezing, shortness of breath, worse symptoms, then recheck, call/return  Influenza, Adult Influenza ("the flu") is a viral infection of the respiratory tract. It causes chills, fever, cough, headache, body aches, and sore throat. Influenza in general will make you feel sicker than when you have a common cold. Symptoms of the illness typically last a few days. Cough and fatigue may continue for as long as 7 to 10 days. Influenza is highly contagious. It spreads easily to others in the droplets from coughs and sneezes. People frequently become infected by touching something that was recently contaminated with the virus and then touch their mouth, nose or eyes. This infection is caused by a virus. Symptoms will not be reduced or improved by taking an antibiotic. Antibiotics are medications that kill bacteria, not viruses. DIAGNOSIS  Diagnosis of influenza is often made based on the history and physical examination as well as the presence of influenza reports occurring in your community. Testing can be done if the diagnosis is not certain. TREATMENT  Since influenza is caused by a virus, antibiotics are not helpful. Your caregiver may prescribe antiviral medicines to shorten the illness and lessen the severity. Your caregiver may also recommend influenza vaccination and/or antiviral medicines for your family members in order to prevent the spread of influenza to them. HOME CARE INSTRUCTIONS  DO NOT GIVE ASPIRIN TO PERSONS WITH INFLUENZA  WHO ARE UNDER 38 YEARS OF AGE. This could lead to brain and liver damage (Reye's syndrome). Read the label on over-the-counter medicines.   Stay home from work or school if at all possible until most of your symptoms are gone.   Only take over-the-counter or prescription medicines for pain, discomfort, or fever as directed by your caregiver.   Use a cool mist humidifier to increase air moisture. This will make breathing easier.   Rest until your temperature is nearly normal: 98.6 F (37 C). This usually takes 3 to 4 days. Be sure you get plenty of rest.   Drink at least eight, eight-ounce glasses of fluids per day. Fluids include water, juice, broth, gelatin, or lemonade.   Cover your mouth and nose when coughing or sneezing and wash your hands often to prevent the spread of this virus to other persons.  PREVENTION  Annual influenza vaccination (flu shots) is the best way to avoid getting influenza. An annual flu shot is now routinely recommended for all adults in the [place] IF:   You develop shortness of breath while resting.   You have a deep cough with production of mucous or chest pain.   You develop nausea (feeling sick to your stomach), vomiting, or diarrhea.  SEEK IMMEDIATE MEDICAL CARE IF:   You have difficulty breathing, become short of breath, or your skin or nails turn bluish.   You develop severe neck pain or stiffness.   You develop a severe headache, facial pain, or earache.   You have a fever.   You develop nausea or vomiting that cannot be controlled.  Document  Released: 11/16/2000 Document Revised: 08/01/2011 Document Reviewed: 09/21/2009 [facility] Patient Information 2012 [place], [place].

## 2015-02-04 ENCOUNTER — Ambulatory Visit: Payer: Self-pay | Admitting: Neurology

## 2015-02-10 ENCOUNTER — Other Ambulatory Visit: Payer: Self-pay | Admitting: Medical

## 2015-02-10 ENCOUNTER — Telehealth: Payer: Self-pay | Admitting: Medical

## 2015-02-10 MED ORDER — DULOXETINE HCL 30 MG PO CPEP: 30.0000 mg | ORAL_CAPSULE | Freq: Every day | ORAL | Status: DC

## 2015-02-10 NOTE — Telephone Encounter (Signed)
I spoke to Tamara Vega her counselor who wanted to change her to Cymbalta given weight gain with Effexor.  Effexor hasn't helped all that much.    Have her stop Effexor and begin Cymbalta once daily.  Switch directly to this.   Have her f/u in 22mo here to recheck on weight, medication, sooner prn.

## 2015-02-10 NOTE — Telephone Encounter (Signed)
LM to CB [facility] 

## 2015-02-11 NOTE — Telephone Encounter (Signed)
LMOM TO CB. CLS 

## 2015-02-14 ENCOUNTER — Other Ambulatory Visit: Payer: Self-pay | Admitting: Medical

## 2015-02-14 MED ORDER — LEVOMILNACIPRAN HCL ER 40 MG PO CP24: 1.0000 | ORAL_CAPSULE | Freq: Every day | ORAL | Status: DC

## 2015-02-14 NOTE — Telephone Encounter (Signed)
LMOM TO CB. CLS 

## 2015-02-14 NOTE — Telephone Encounter (Signed)
i called out Fitzema instead of Cymbalta since that one was going to cost so much.  If this too expensive, have her check insurance coverage for copay.  Give coupon cards if we have them.  If this is cheaper, f/u 3-4 wk.

## 2015-02-14 NOTE — Telephone Encounter (Signed)
I called and I spoke with the pharmacy and she said that the generic of Cymbalta is costing her $90.99. Please, advise on what to do next?

## 2015-02-15 NOTE — Telephone Encounter (Signed)
I left a message on the patients voicemail and ask her to call back to the office

## 2015-02-17 NOTE — Telephone Encounter (Signed)
LM to CB [facility] 

## 2015-02-18 ENCOUNTER — Encounter: Payer: Self-pay | Admitting: Neurology

## 2015-02-18 NOTE — Telephone Encounter (Signed)
I called the patient on her work number per the message that was left on voicemail. Patient was upset and not very happy. Patient states that she is not happy with this practice and nothing against Dorothea Ogle PA. She states that she is tired of calling up her and being placed on hold. She is tired of calling up here and being ask if she is a patient  And she states " if I wasn't a patient there why would I call. She also said that she has told the people up front to mark it in her chart to be called on her work phone number. There is no  documentation in the chart marking her work phone number. She states that she is not going to take medication that is going to cost her $99 dollars without anybody talking to her first. I apologized to the patient for any inconvenience we may have caused her. I told her that I would send a message to [employer] PA. Patient states that there is nothing that we could do right now for her. She states that she is going to go see her therapists on Monday and talk with her and that she was going to get her records and transfer from this practice. She states that she didn't want any medication at this time. She would speak to her therapists on Monday. Please, advise what to do next

## 2015-02-22 ENCOUNTER — Telehealth: Payer: Self-pay | Admitting: Medical

## 2015-02-22 NOTE — Telephone Encounter (Signed)
P.A. Terie Purser approved til 02/22/16, called pharmacy and went thru ins for $99.10.  Printed and activated discount card and pharmacy reran and cost is $0 for first month and $around $20 a month for year.  Tamara Vega due to recent circumstances do you want to call pt and let pt know about this?

## 2015-02-23 NOTE — Telephone Encounter (Signed)
i called today and left message to call me personally.

## 2015-03-02 ENCOUNTER — Other Ambulatory Visit: Payer: Self-pay | Admitting: Medical

## 2015-03-09 NOTE — Telephone Encounter (Signed)
Tamara Vega, please try and call this patient at work.  I had called and left message to speak with her personally, but she hasn't called back.  She apparently was upset with out office, wanted to leave our practice.  I feel like we have been very accommodating to her and we wants to nurture this relationship.  She was frustrated about some of the phone calls she has had with our office, but because of the number of patient we see, the number of new patients, and for a variety of reasons, certain questions have to be asked when a patient calls here.  I have reiterated to the front office who she is, her name, and to get familiar with her name so she isn't frustrated on the phone when calling in to speak with nurse or me.  Further, when we last spoke to her by phone, she was upset that we had prescribed a mediation without discussing it with her.  That was the whole reason for the call to go over this recommendation in conjunction with her counselor and saving her an office visit.   By all means, we would prefer to go over these changes in person, but this was handled this way for her convenience.     If she is not coming back, then just document this, but we are making attempts with this call to preserve this relationship.

## 2015-03-10 NOTE — Telephone Encounter (Signed)
Tamara Vega, patient says she will not be returning to this office.

## 2015-03-10 NOTE — Telephone Encounter (Signed)
Left message for patient to call back to speak with you or me.

## 2015-04-24 LAB — HM PAP SMEAR: HM Pap smear: NEGATIVE

## 2015-06-10 ENCOUNTER — Telehealth: Payer: Self-pay | Admitting: Internal Medicine

## 2015-06-10 ENCOUNTER — Other Ambulatory Visit: Payer: Self-pay | Admitting: Medical

## 2015-06-10 NOTE — Telephone Encounter (Signed)
Pt called stating her therapist lauren Buena Irish wanted her to go back on Klonopin. [facility] [place] [facility] [address]

## 2015-06-10 NOTE — Telephone Encounter (Signed)
I can't call out any medications.  She left the practice, had went back to her primary care provider or other physician she had been seeing.     FYI - It was my understanding that she left the practice, unhappy with [place], and had re-established with her primary care she had been seeing before [place].

## 2015-06-13 NOTE — Telephone Encounter (Signed)
Will need appt before we can restart medication, discuss.

## 2015-06-13 NOTE — Telephone Encounter (Signed)
Pt states she never went back to her pcp. She had a bad experience here but spoke with her therapist and agreed to try it again. Pt made an appt to talk about med on wednesday

## 2015-06-13 NOTE — Telephone Encounter (Signed)
Left message for pt to call me back 

## 2015-06-15 ENCOUNTER — Encounter: Payer: Self-pay | Admitting: Medical

## 2015-06-15 ENCOUNTER — Ambulatory Visit (INDEPENDENT_AMBULATORY_CARE_PROVIDER_SITE_OTHER): Payer: 59 | Admitting: Medical

## 2015-06-15 VITALS — BP 120/80 | HR 110 | Resp 16 | Wt 195.0 lb

## 2015-06-15 DIAGNOSIS — F410 Panic disorder [episodic paroxysmal anxiety] without agoraphobia: Secondary | ICD-10-CM

## 2015-06-15 DIAGNOSIS — G4700 Insomnia, unspecified: Secondary | ICD-10-CM | POA: Diagnosis not present

## 2015-06-15 DIAGNOSIS — F411 Generalized anxiety disorder: Secondary | ICD-10-CM | POA: Diagnosis not present

## 2015-06-15 MED ORDER — VENLAFAXINE HCL ER 37.5 MG PO CP24: 37.5000 mg | ORAL_CAPSULE | Freq: Every day | ORAL | Status: DC

## 2015-06-15 MED ORDER — ALPRAZOLAM 0.5 MG PO TABS: 0.5000 mg | ORAL_TABLET | Freq: Every evening | ORAL | Status: DC | PRN

## 2015-06-15 MED ORDER — TRAZODONE HCL 50 MG PO TABS: ORAL_TABLET | ORAL | Status: DC

## 2015-06-15 NOTE — Patient Instructions (Signed)
Recommendations:  Begin back on Effexor 37.5mg  daily  After 3-4 weeks we can consider increasing back to 75mg  daily which is what you were taking prior  Begin back on Trazodone sleep medications, 1/2 tablet at bedtime initially.  After a week or 2, you can go to 1 whole tablet nightly if needed  Use Xanax as needed for panic attack up to twice daily if needed   Lets follow up within 4-6 weeks

## 2015-06-15 NOTE — Progress Notes (Signed)
Subjective: Here for follow-up on medications. She was originally referred here by Herby Abraham at [facility] for Cognitive and Behavioral Therapy.  After her last visit in December, she ended up leaving our practice for a while, voicing unhappiness over a phone call here and getting a call at her employer by [place] despite that she would not return or answer any other phone calls.  Nevertheless, she is back and I did speak with Herby Abraham yesterday about her, and about starting back on Effexor XR capsules .  Hx/o anxiety - since last visit was doing ok until May then got worse again.   Lots of issue with anxiety.   Gets physically nauseated and shaky in the mornings, even the thought of going to work causes a lot of anxiety.  Lauren did tell her to use the clonazepam she has left over if she is having that bad of panic episode prior to going to work. Yesterday felt panicky, shaky, sweaty, didn't want to be around anyone.  The last time she was on daily medication was March.    Her stressors include work and family stress. However, back in May she did have a talk to her parents and sister about their cultural concerns and some of their disapproval with some things she was doing, and she finally told them to back off and give her space that she was an adult and can make decisions for herself .  This helped, and their relationship has improved.   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   Feels disrespect daily, was called "blacky" due to her tan after coming back from the beach, has had some other unprofessional comments towards her from co-workers that she feels is not appropriate particularly in a legal office, feels like she doesn't have room for career advancement and personal growth at this office.   These all add to the daily stress.    Insomnia - is out of trazodone, but had done well on this.    Sees rheumatology, Dr. Lenna Gilford, currently on  Plaquenil and Diclofenac.  ROS as in subjective  Objective: BP 120/80 mmHg  Pulse 110  Resp 16  Wt 195 lb (88.451 kg)   Wt Readings from Last 3 Encounters:  06/15/15 195 lb (88.451 kg)  01/26/15 179 lb (81.194 kg)  11/23/14 182 lb (82.555 kg)   Gen: wd, wn, nad Psych: pleasant, answers questions appropriately, good eye contact   Assessment: Encounter Diagnoses  Name Primary?  . Generalized anxiety disorder Yes  . Panic disorder   . Insomnia      Plan: We discussed her concerns and recent issues. She will continue counseling with Lauren.  Begin back on Effexor low dose, and plan to move to 75mg  in a month or so.   Can use xanax prn, restart trazodone for sleep.   C/t coping skills that she has learned from [facility].   Recheck 4-6 wk.   Patient Instructions  Recommendations:  Begin back on Effexor 37.5mg  daily  After 3-4 weeks we can consider increasing back to 75mg  daily which is what you were taking prior  Begin back on Trazodone sleep medications, 1/2 tablet at bedtime initially.  After a week or 2, you can go to 1 whole tablet nightly if needed  Use Xanax as needed for panic attack up to twice daily if needed   Lets follow up within 4-6 weeks

## 2015-06-28 ENCOUNTER — Telehealth: Payer: Self-pay | Admitting: Internal Medicine

## 2015-06-28 NOTE — Telephone Encounter (Signed)
Herby Abraham called wanting to know if you could up pt effexor to 75mg . Please advise or call lauren to speak with her @ [phone]

## 2015-06-29 ENCOUNTER — Other Ambulatory Visit: Payer: Self-pay | Admitting: Medical

## 2015-06-29 MED ORDER — VENLAFAXINE HCL ER 75 MG PO CP24: 75.0000 mg | ORAL_CAPSULE | Freq: Every day | ORAL | Status: DC

## 2015-06-29 NOTE — Telephone Encounter (Signed)
Called and left detailed message for Herby Abraham and also left a detailed on pt cell phone with the info about med and to follow-up here in 2-3 weeks

## 2015-06-29 NOTE — Telephone Encounter (Signed)
Let Ander Purpura (counselor) and patient Tamara Vega know that she can take 2 of her 37.5 mg daily until she runs out.  I did sent the 75mg  to the pharmacy to change to when she runs out.  Have her f/u here in 2-3 wk on med check.

## 2015-07-30 ENCOUNTER — Other Ambulatory Visit: Payer: Self-pay | Admitting: Medical

## 2015-08-01 ENCOUNTER — Telehealth: Payer: Self-pay | Admitting: Medical

## 2015-08-01 NOTE — Telephone Encounter (Signed)
Pt's therapist, Allene Dillon, called requesting to speak to [facility].Loren wants to know if Audelia Acton will increase Effexor to 150mg 

## 2015-08-03 ENCOUNTER — Other Ambulatory Visit: Payer: Self-pay | Admitting: Medical

## 2015-08-03 MED ORDER — VENLAFAXINE HCL ER 150 MG PO CP24: 150.0000 mg | ORAL_CAPSULE | Freq: Every day | ORAL | Status: DC

## 2015-08-03 NOTE — Telephone Encounter (Signed)
Please call Lauren Atkinson/therapist [facility] for Cognitive Behavioral Therapy) back, let her know that I sent the increased dose to pharmacy.   She can double up on the 75mg  dose until she runs out.   I can get on the phone after you let her know.

## 2015-08-04 NOTE — Telephone Encounter (Signed)
Left message for Lauren and Amarisa.

## 2015-09-14 ENCOUNTER — Other Ambulatory Visit: Payer: Self-pay | Admitting: Medical

## 2015-09-14 NOTE — Telephone Encounter (Signed)
Is this okay to refill? 

## 2015-09-14 NOTE — Telephone Encounter (Signed)
Please get her back for f/u appt (54mo f/u).  Trazodone sent/refill

## 2015-09-24 ENCOUNTER — Other Ambulatory Visit: Payer: Self-pay | Admitting: Medical

## 2015-09-26 NOTE — Telephone Encounter (Signed)
Is this ok to refill?  

## 2015-10-14 ENCOUNTER — Ambulatory Visit (INDEPENDENT_AMBULATORY_CARE_PROVIDER_SITE_OTHER): Payer: 59 | Admitting: Family Medicine

## 2015-10-14 VITALS — BP 120/88 | HR 111 | Temp 98.7°F | Ht 63.0 in | Wt 195.0 lb

## 2015-10-14 DIAGNOSIS — J209 Acute bronchitis, unspecified: Secondary | ICD-10-CM

## 2015-10-14 MED ORDER — AZITHROMYCIN 500 MG PO TABS: 500.0000 mg | ORAL_TABLET | Freq: Every day | ORAL | Status: DC

## 2015-10-14 NOTE — Progress Notes (Signed)
   Subjective:    Patient ID: Tamara Vega, female    DOB: 08/24/1986, 29 y.o.   MRN: [id number]  HPI  she complains of a five-day history that started with cough and slight sore throat but no fever chills or earache. Several days later she developed fever chills, myalgia, increased cough, fatigue and dizziness. She states her temperature was 101.8. She does not smoke.   Review of Systems     Objective:   Physical Exam Alert and in no distress. Tympanic membranes and canals are normal. Pharyngeal area is normal. Neck is supple without adenopathy or thyromegaly. Cardiac exam shows a regular sinus rhythm without murmurs or gallops. Lungs are clear to auscultation.        Assessment & Plan:  Acute bronchitis, unspecified organism - Plan: azithromycin (ZITHROMAX) 500 MG tablet  her symptoms do sound as if she has a bacterial infection. We'll place her on azithromycin and she will call if continued difficulty.

## 2015-10-30 ENCOUNTER — Other Ambulatory Visit: Payer: Self-pay | Admitting: Medical

## 2015-10-31 NOTE — Telephone Encounter (Signed)
Is this ok to refill?  

## 2015-11-03 ENCOUNTER — Other Ambulatory Visit: Payer: Self-pay | Admitting: Family Medicine

## 2015-11-03 NOTE — Telephone Encounter (Signed)
Is this okay?

## 2015-11-13 ENCOUNTER — Other Ambulatory Visit: Payer: Self-pay | Admitting: Medical

## 2015-11-14 NOTE — Telephone Encounter (Signed)
Is this ok to refill?  

## 2015-11-21 ENCOUNTER — Other Ambulatory Visit: Payer: Self-pay | Admitting: Family Medicine

## 2015-11-21 ENCOUNTER — Other Ambulatory Visit: Payer: Self-pay | Admitting: Medical

## 2015-11-21 NOTE — Telephone Encounter (Signed)
Is this ok to refill?  

## 2015-11-21 NOTE — Telephone Encounter (Signed)
Is this okay to call in? 

## 2015-11-21 NOTE — Telephone Encounter (Signed)
Okay to renew

## 2015-11-22 ENCOUNTER — Telehealth: Payer: Self-pay | Admitting: Medical

## 2015-11-22 NOTE — Telephone Encounter (Signed)
LMTCB

## 2015-11-22 NOTE — Telephone Encounter (Signed)
Med check appt is made for friday

## 2015-11-22 NOTE — Telephone Encounter (Signed)
Last visit was 06/2015.  She is due back for f/u on meds.  Please set her up f/u, or if last physical > 1year ago, can set up for CPX.

## 2015-11-25 ENCOUNTER — Encounter: Payer: Self-pay | Admitting: Medical

## 2015-11-25 ENCOUNTER — Ambulatory Visit (INDEPENDENT_AMBULATORY_CARE_PROVIDER_SITE_OTHER): Payer: 59 | Admitting: Medical

## 2015-11-25 VITALS — BP 110/80 | HR 92 | Wt 195.0 lb

## 2015-11-25 DIAGNOSIS — Z566 Other physical and mental strain related to work: Secondary | ICD-10-CM

## 2015-11-25 DIAGNOSIS — G4700 Insomnia, unspecified: Secondary | ICD-10-CM | POA: Diagnosis not present

## 2015-11-25 DIAGNOSIS — F411 Generalized anxiety disorder: Secondary | ICD-10-CM | POA: Diagnosis not present

## 2015-11-25 MED ORDER — TRAZODONE HCL 50 MG PO TABS: ORAL_TABLET | ORAL | Status: DC

## 2015-11-25 MED ORDER — VENLAFAXINE HCL ER 150 MG PO CP24: 150.0000 mg | ORAL_CAPSULE | Freq: Every day | ORAL | Status: DC

## 2015-11-25 NOTE — Progress Notes (Signed)
Subjective: Chief Complaint  Patient presents with  . Follow-up    pt states everything is going well no problems.    Here for f/u on medication. Taking Effexor 150mg  XR daily, Xanax typically 3-4 days per week.   Sees Herby Abraham at [facility] for Cognitive and Behavior Therapy.  Still working same job with same stressors.   Has some travel goals for next year planned, going to [place] in February, March her sister is taking her to a [place] show.   Her counselor has structured plans for her, been doing breathing exercises, has checklist of things she goes through when someone or something at work stresses her out.    Has renewed her [employer], planning to get back lap swimming soon.  Currently walking for exercise daily.   Works schedule last few months is extreme.  Still has issues with her paralegal who has been disrespectful in the past.   Her firm has some unyielding policies that she is not happy with.  Was told last minute that she had to be the firm representative to fly up to [place] at less than 24 hours notice to attend one of their employee's funeral.   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  She is considering moving back to [place] for work .  Her current medications seems to be working fine.   Getting 6 hours of sleep nightly on average which is better than in prior years. No other aggravating or relieving factors. No other complaint.  Past Medical History  Diagnosis Date  . Seasonal allergies   . Generalized anxiety disorder   . Insomnia    ROS as in subjective   Objective: BP 110/80 mmHg  Pulse 92  Wt 195 lb (88.451 kg)  LMP 11/16/2015  Gen: wd, wn, nad Psych: pleasant, good eye contact, answers questions appropriately    Assessment: Encounter Diagnoses  Name Primary?  . Generalized anxiety disorder Yes  . Insomnia   . Work-related stress      Plan: Doing ok on current medication, c/t counseling with Lauren,  work on exercises consistently and starting back lap swimming.  She continues to see rheumatology.   discussed goal setting, discussed setting boundaries with employer.  F/u 3-6 mo, sooner prn.

## 2016-02-15 ENCOUNTER — Other Ambulatory Visit: Payer: Self-pay | Admitting: Family Medicine

## 2016-02-16 NOTE — Telephone Encounter (Signed)
Phoned in.

## 2016-02-16 NOTE — Telephone Encounter (Signed)
Is this okay?

## 2016-02-16 NOTE — Telephone Encounter (Signed)
Call out xanax 

## 2016-03-07 ENCOUNTER — Other Ambulatory Visit: Payer: Self-pay | Admitting: Medical

## 2016-03-07 NOTE — Telephone Encounter (Signed)
Is this ok to refill?  

## 2016-05-02 LAB — PROTIME-INR

## 2016-05-09 ENCOUNTER — Other Ambulatory Visit: Payer: Self-pay | Admitting: Medical

## 2016-05-09 ENCOUNTER — Encounter: Payer: Self-pay | Admitting: Medical

## 2016-05-09 MED ORDER — METHOCARBAMOL 500 MG PO TABS: 500.0000 mg | ORAL_TABLET | Freq: Four times a day (QID) | ORAL | Status: DC

## 2016-05-13 ENCOUNTER — Other Ambulatory Visit: Payer: Self-pay | Admitting: Medical

## 2016-05-14 NOTE — Telephone Encounter (Signed)
Is this ok to refill?  

## 2016-06-03 ENCOUNTER — Other Ambulatory Visit: Payer: Self-pay | Admitting: Medical

## 2016-06-04 NOTE — Telephone Encounter (Signed)
Is this ok to refill?  

## 2016-06-06 ENCOUNTER — Telehealth: Payer: Self-pay

## 2016-06-06 NOTE — Telephone Encounter (Signed)
Pt called and wanted Humira added to her medication list

## 2016-06-06 NOTE — Telephone Encounter (Signed)
LM for pt to call back.

## 2016-06-06 NOTE — Telephone Encounter (Signed)
Lets get her in for either physical or recheck on medication.  Can send 1 round of this.

## 2016-06-07 ENCOUNTER — Ambulatory Visit (INDEPENDENT_AMBULATORY_CARE_PROVIDER_SITE_OTHER): Payer: 59 | Admitting: Medical

## 2016-06-07 ENCOUNTER — Encounter: Payer: Self-pay | Admitting: Medical

## 2016-06-07 VITALS — BP 124/90 | HR 101 | Ht 62.0 in | Wt 208.0 lb

## 2016-06-07 DIAGNOSIS — R030 Elevated blood-pressure reading, without diagnosis of hypertension: Secondary | ICD-10-CM | POA: Insufficient documentation

## 2016-06-07 DIAGNOSIS — K219 Gastro-esophageal reflux disease without esophagitis: Secondary | ICD-10-CM

## 2016-06-07 DIAGNOSIS — Z0000 Encounter for general adult medical examination without abnormal findings: Secondary | ICD-10-CM | POA: Insufficient documentation

## 2016-06-07 DIAGNOSIS — Z833 Family history of diabetes mellitus: Secondary | ICD-10-CM

## 2016-06-07 DIAGNOSIS — F411 Generalized anxiety disorder: Secondary | ICD-10-CM | POA: Diagnosis not present

## 2016-06-07 DIAGNOSIS — Z79899 Other long term (current) drug therapy: Secondary | ICD-10-CM | POA: Insufficient documentation

## 2016-06-07 DIAGNOSIS — G4700 Insomnia, unspecified: Secondary | ICD-10-CM

## 2016-06-07 DIAGNOSIS — M069 Rheumatoid arthritis, unspecified: Secondary | ICD-10-CM

## 2016-06-07 DIAGNOSIS — E669 Obesity, unspecified: Secondary | ICD-10-CM | POA: Diagnosis not present

## 2016-06-07 LAB — POCT URINALYSIS DIPSTICK
BILIRUBIN UA: NEGATIVE
Blood, UA: NEGATIVE
Glucose, UA: NEGATIVE
Ketones, UA: NEGATIVE
LEUKOCYTES UA: NEGATIVE
NITRITE UA: NEGATIVE
PH UA: 6
PROTEIN UA: NEGATIVE
Spec Grav, UA: >=1.03
Urobilinogen, UA: NEGATIVE

## 2016-06-07 LAB — POCT GLYCOSYLATED HEMOGLOBIN (HGB A1C): Hemoglobin A1C: 5.8

## 2016-06-07 MED ORDER — ALPRAZOLAM 0.5 MG PO TABS: ORAL_TABLET | ORAL | Status: DC

## 2016-06-07 MED ORDER — VENLAFAXINE HCL ER 150 MG PO CP24: ORAL_CAPSULE | ORAL | Status: DC

## 2016-06-07 MED ORDER — TRAZODONE HCL 50 MG PO TABS: ORAL_TABLET | ORAL | Status: DC

## 2016-06-07 NOTE — Progress Notes (Signed)
Subjective:   HPI  Tamara Vega is a 30 y.o. female who presents for a complete physical.  Chief Complaint  Patient presents with  . Annual Exam    sees eye doc, sees GYN Mccormick, Rheumatologist Angela Hocks, no other doctors.    Medical care team includes:  Sees Dr. Jess Barters, gynecology  Dr. Gavin Pound, rheumatology [facility], PA-C here for primary care Doesn't have dentist currently Sees eye doctor  Concerns: Is compliant with effexor for mood.   Uses trazodone a little more of late for insomnia.  Would like a refill on xanax for prn use.   She notes recently having more job stress and home stress.   Mom had knee replacement surgery in [place] recently, and she has been taking care of mother's dog.  She is working more hours lately as her coworkers have been on vacation various weeks.  Is attending a funeral today.     She was diagnosed last year with RA, started Humira back in 04/2016, getting labs q3 mo through rheumatology.    Reviewed their medical, surgical, family, social, medication, and allergy history and updated chart as appropriate.  Past Medical History  Diagnosis Date  . Seasonal allergies   . Generalized anxiety disorder   . Insomnia   . Rheumatoid arthritis ([place]) 04/2015    started Humira 04/2016; Dr. Gavin Pound  . GERD (gastroesophageal reflux disease)   . Obesity   . Family history of diabetes mellitus   . High risk medication use     Humira, PPI  . Chronic headache     Past Surgical History  Procedure Laterality Date  . No past surgeries      06/2016    Social History   Social History  . Marital Status: Single    Spouse Name: N/A  . Number of Children: N/A  . Years of Education: N/A   Occupational History  . Not on file.   Social History Main Topics  . Smoking status: Never Smoker   . Smokeless tobacco: Not on file  . Alcohol Use: 0.6 oz/week    1 Glasses of wine per week  . Drug Use: No  . Sexual Activity: Not on  file   Other Topics Concern  . Not on file   Social History Narrative   Single, attorney, [place] [employer].   Walking for exercise, 15-20 minutes daily.   Plans to get back to swimming again soon.  Works many hours.   No significant other currently.  As of 06/2016    Family History  Problem Relation Age of Onset  . Diabetes Father   . Hypertension Father   . High Cholesterol Father   . Diabetes Mother   . Hypertension Mother   . High Cholesterol Mother   . Heart disease Maternal Uncle   . Heart disease Maternal Grandmother   . Heart disease Maternal Grandfather   . Cancer Neg Hx      Current outpatient prescriptions:  .  Cranberry 1000 MG CAPS, Take by mouth., Disp: , Rfl:  .  hydroxychloroquine (PLAQUENIL) 200 MG tablet, Take by mouth daily., Disp: , Rfl:  .  Multiple Vitamins-Minerals (MULTIVITAMIN WITH MINERALS) tablet, Take 1 tablet by mouth daily., Disp: , Rfl:  .  norethindrone-ethinyl estradiol-iron (MICROGESTIN FE,GILDESS FE,LOESTRIN FE) 1.5-30 MG-MCG tablet, Take 1 tablet by mouth daily., Disp: , Rfl:  .  omeprazole (PRILOSEC OTC) 20 MG tablet, Take 20 mg by mouth daily., Disp: , Rfl:  .  traZODone (DESYREL) 50 MG tablet, TAKE 1/2-1 TABLET BY MOUTH AT BEDTIME FOR SLEEP, Disp: 90 tablet, Rfl: 3 .  venlafaxine XR (EFFEXOR-XR) 150 MG 24 hr capsule, TAKE 1 CAPSULE BY MOUTH DAILY WITH BREAKFAST., Disp: 90 capsule, Rfl: 3 .  Adalimumab (HUMIRA) 40 MG/0.8ML PSKT, Inject into the skin daily., Disp: , Rfl:  .  ALPRAZolam (XANAX) 0.5 MG tablet, TAKE 1 TABLET DAILY AT BEDTIME AS NEEDED, Disp: 30 tablet, Rfl: 1 .  methocarbamol (ROBAXIN) 500 MG tablet, Take 1 tablet (500 mg total) by mouth 4 (four) times daily. (Patient not taking: Reported on 06/07/2016), Disp: 15 tablet, Rfl: 0 .  mometasone (NASONEX) 50 MCG/ACT nasal spray, Place 2 sprays into the nose daily. (Patient not taking: Reported on 11/25/2015), Disp: 17 g, Rfl: 0  Allergies  Allergen Reactions  . Bactrim  [Sulfamethoxazole-Trimethoprim] Swelling    reddness and itching   . Diclofenac     Blood in stool once 2016    Review of Systems Constitutional: -fever, -chills, -sweats, -unexpected weight change, -decreased appetite, -fatigue Allergy: -sneezing, -itching, -congestion Dermatology: -changing moles, --rash, -lumps ENT: -runny nose, -ear pain, -sore throat, -hoarseness, -sinus pain, -teeth pain, - ringing in ears, -hearing loss, -nosebleeds Cardiology: -chest pain, -palpitations, -swelling, -difficulty breathing when lying flat, -waking up short of breath Respiratory: -cough, -shortness of breath, -difficulty breathing with exercise or exertion, -wheezing, -coughing up blood Gastroenterology: -abdominal pain, -nausea, -vomiting, -diarrhea, -constipation, -blood in stool, -changes in bowel movement, -difficulty swallowing or eating Hematology: -bleeding, -bruising  Musculoskeletal: -joint aches, -muscle aches, +joint swelling, -back pain, -neck pain, -cramping, -changes in gait Ophthalmology: denies vision changes, eye redness, itching, discharge Urology: -burning with urination, -difficulty urinating, -blood in urine, -urinary frequency, -urgency, -incontinence Neurology: -headache, -weakness, -tingling, -numbness, -memory loss, -falls, -dizziness Psychology: -depressed mood, +agitation, +sleep problems     Objective:   Physical Exam  BP 124/90 mmHg  Pulse 101  Ht 5\' 2"  (1.575 m)  Wt 208 lb (94.348 kg)  BMI 38.03 kg/m2  LMP 05/31/2016  BP Readings from Last 3 Encounters:  06/07/16 124/90  11/25/15 110/80  10/14/15 120/88   Wt Readings from Last 3 Encounters:  06/07/16 208 lb (94.348 kg)  11/25/15 195 lb (88.451 kg)  10/14/15 195 lb (88.451 kg)    General appearance: alert, no distress, WD/WN, [place] female Skin: scattered macules, no worrisome lesions HEENT: normocephalic, conjunctiva/corneas normal, sclerae anicteric, PERRLA, EOMi, nares patent, no discharge or erythema,  pharynx normal Oral cavity: MMM, tongue normal, teeth normal Neck: supple, no lymphadenopathy, no thyromegaly, no masses, normal ROM, no bruits Chest: non tender, normal shape and expansion Heart: RRR, normal S1, S2, no murmurs Lungs: CTA bilaterally, no wheezes, rhonchi, or rales Abdomen: +bs, soft, non tender, non distended, no masses, no hepatomegaly, no splenomegaly, no bruits Back: non tender, normal ROM, no scoliosis Musculoskeletal: upper extremities non tender, no obvious deformity, normal ROM throughout, lower extremities non tender, no obvious deformity, normal ROM throughout Extremities: no edema, no cyanosis, no clubbing Pulses: 2+ symmetric, upper and lower extremities, normal cap refill Neurological: alert, oriented x 3, CN2-12 intact, strength normal upper extremities and lower extremities, sensation normal throughout, DTRs 2+ throughout, no cerebellar signs, gait normal Psychiatric: normal affect, behavior normal, pleasant  Breast/gyn/rectal - deferred to gyn    Assessment and Plan :    Encounter Diagnoses  Name Primary?  . Encounter for health maintenance examination in adult Yes  . Rheumatoid arthritis flare (HCC)   . Generalized anxiety disorder   .  Insomnia   . Gastroesophageal reflux disease without esophagitis   . High risk medication use   . Family history of diabetes mellitus   . Elevated blood pressure reading without diagnosis of hypertension    Physical exam - discussed healthy lifestyle, diet, exercise, preventative care, vaccinations, and addressed their concerns.   See your eye doctor yearly for routine vision care. See your dentist yearly for routine dental care including hygiene visits twice yearly. See your gynecologist yearly for routine gynecological care. RA - f/u with rheumatology q2mo C/t current medications for anxiety, insomnia, c/t to work on stress management, balance in life, getting routine exercise Consider taking zantac instead of  Prilosec due to long term risks of Prilosec and similar acid blockers Monitor BPs outside of here.   Discussed goal BPs. Work on healthy lifestyle for weight loss.  Follow-up q51mo on medications  [place] was seen today for annual exam.  Diagnoses and all orders for this visit:  Encounter for health maintenance examination in adult -     POCT urinalysis dipstick  Rheumatoid arthritis flare (HCC)  Generalized anxiety disorder  Insomnia  Gastroesophageal reflux disease without esophagitis  High risk medication use  Family history of diabetes mellitus -     POCT glycosylated hemoglobin (Hb A1C)  Elevated blood pressure reading without diagnosis of hypertension  Obesity  Other orders -     venlafaxine XR (EFFEXOR-XR) 150 MG 24 hr capsule; TAKE 1 CAPSULE BY MOUTH DAILY WITH BREAKFAST. -     traZODone (DESYREL) 50 MG tablet; TAKE 1/2-1 TABLET BY MOUTH AT BEDTIME FOR SLEEP -     ALPRAZolam (XANAX) 0.5 MG tablet; TAKE 1 TABLET DAILY AT BEDTIME AS NEEDED

## 2016-06-07 NOTE — Patient Instructions (Signed)
Encounter Diagnoses  Name Primary?  . Encounter for health maintenance examination in adult Yes  . Rheumatoid arthritis flare (HCC)   . Generalized anxiety disorder   . Insomnia   . Gastroesophageal reflux disease without esophagitis   . High risk medication use   . Family history of diabetes mellitus   . Elevated blood pressure reading without diagnosis of hypertension    Recommendations:  See your eye doctor yearly for routine vision care.  See your dentist yearly for routine dental care including hygiene visits twice yearly.  See your gynecologist yearly for routine gynecological care.  See rheumatology as scheduled  Exercise regularly, get back on swimming routine  Eat a healthy low fat low cholesterol diet  Consider limiting the use or Prilosec/omeprazole due to long term risks  Use Zantac instead of Prilosec  Monitor blood pressures. Goal is 120/80 or less  Follow up every 6 months for medication monitoring.

## 2016-06-08 DIAGNOSIS — E669 Obesity, unspecified: Secondary | ICD-10-CM | POA: Insufficient documentation

## 2016-06-12 ENCOUNTER — Encounter: Payer: Self-pay | Admitting: Medical

## 2016-10-01 ENCOUNTER — Telehealth: Payer: Self-pay | Admitting: Medical

## 2016-10-01 NOTE — Telephone Encounter (Signed)
Refill request from [facility]     vanlafaxine HCL ER 150 mg #270  Last fill 06/29/16

## 2016-10-02 ENCOUNTER — Other Ambulatory Visit: Payer: Self-pay | Admitting: Medical

## 2016-10-02 MED ORDER — VENLAFAXINE HCL ER 150 MG PO CP24: ORAL_CAPSULE | ORAL | 0 refills | Status: DC

## 2016-10-02 NOTE — Telephone Encounter (Signed)
i'll refill for #90 as usual

## 2016-10-20 ENCOUNTER — Other Ambulatory Visit: Payer: Self-pay | Admitting: Medical

## 2016-10-22 NOTE — Telephone Encounter (Signed)
Is this okay to refill? 

## 2016-10-23 ENCOUNTER — Other Ambulatory Visit: Payer: Self-pay | Admitting: Medical

## 2016-10-23 NOTE — Addendum Note (Signed)
Addended by: Minette Headland A on: 10/23/2016 08:13 AM   Modules accepted: Orders

## 2016-10-23 NOTE — Telephone Encounter (Signed)
Is this okay to refill? 

## 2016-10-24 ENCOUNTER — Telehealth: Payer: Self-pay

## 2016-10-24 NOTE — Telephone Encounter (Signed)
[facility]ed [facility] d/t failed transmission to pharmacy x 2. Victorino December

## 2016-11-13 ENCOUNTER — Encounter: Payer: Self-pay | Admitting: Medical

## 2016-12-06 ENCOUNTER — Encounter: Payer: Self-pay | Admitting: Medical

## 2016-12-06 ENCOUNTER — Ambulatory Visit (INDEPENDENT_AMBULATORY_CARE_PROVIDER_SITE_OTHER): Payer: 59 | Admitting: Medical

## 2016-12-06 VITALS — BP 132/74 | HR 104 | Wt 207.4 lb

## 2016-12-06 DIAGNOSIS — M069 Rheumatoid arthritis, unspecified: Secondary | ICD-10-CM | POA: Diagnosis not present

## 2016-12-06 DIAGNOSIS — Z820 Family history of epilepsy and other diseases of the nervous system: Secondary | ICD-10-CM

## 2016-12-06 DIAGNOSIS — Z566 Other physical and mental strain related to work: Secondary | ICD-10-CM | POA: Diagnosis not present

## 2016-12-06 DIAGNOSIS — G4700 Insomnia, unspecified: Secondary | ICD-10-CM | POA: Diagnosis not present

## 2016-12-06 DIAGNOSIS — F411 Generalized anxiety disorder: Secondary | ICD-10-CM | POA: Diagnosis not present

## 2016-12-06 MED ORDER — ALPRAZOLAM 0.5 MG PO TABS: ORAL_TABLET | ORAL | 1 refills | Status: DC

## 2016-12-06 NOTE — Patient Instructions (Signed)
Tamara Vega [address]  [address] [place], [place] [address] [phone] [email or web address]  

## 2016-12-06 NOTE — Progress Notes (Signed)
Subjective: Chief Complaint  Patient presents with  . 6 month follow up    6 month follow up , congestion  runny nose.    Here for f/u on anxiety, insomnia, mood.  She is still seeing Lauren Atkinson's, therapist regularly which is helpful for her to deal with her stressors.   She had a busy December at work, but did end up taking a vacation to [place] in November.  She has set goals for 2018 to spend more time on her self, and less time on work.   During the week she tries to limit work after 6pm.  Lately still having problems with sleep.  Still uses 1/4 tablet trazodone regularly, but higher doses make her to lethargic.    Uses xanax 1-2 times per week, helps calm her.  Usually takes this in the morning to calm her nerves.   She is using a "sleep lotion" to help get sleep.  She is exercising - walking with the dogs, not particularly regular, but trying to get 7600-10000 steps daily  In the past clonidine made her too lethargic.    She had been having a lot of eye twitching.  Saw her eye doctor, advised it was stress related.  She has plans to get back to lap swimming at [employer], eating health, and considering yoga.  No prior sleep study, but father has hx/o OSA.  She notes sometimes fatigued, sometimes daytime somnolence.    Snores, but no witnessed apnea.     She is still seeing rheumatology regularly (Dr. Gavin Pound), has recently went to weekly Humira instead of every 2 weeks.  Saw gynecology in summer, had labs then.     Last Td vaccine 2010.   Had flu shot 10/01/16.   Last EKG - 2011 with PCP Dr. Jodi Marble at [facility] in [place], [place].  Has goal weight this year of 165lb.   Past Medical History:  Diagnosis Date  . Chronic headache   . Family history of diabetes mellitus   . Generalized anxiety disorder   . GERD (gastroesophageal reflux disease)   . High risk medication use    Humira, PPI  . Insomnia   . Obesity   . Rheumatoid arthritis ([place]) 04/2015   started Humira  04/2016; Dr. Gavin Pound  . Seasonal allergies    Current Outpatient Prescriptions on File Prior to Visit  Medication Sig Dispense Refill  . Adalimumab (HUMIRA) 40 MG/0.8ML PSKT Inject into the skin daily.    . Cranberry 1000 MG CAPS Take by mouth.    . mometasone (NASONEX) 50 MCG/ACT nasal spray Place 2 sprays into the nose daily. 17 g 0  . Multiple Vitamins-Minerals (MULTIVITAMIN WITH MINERALS) tablet Take 1 tablet by mouth daily.    . norethindrone-ethinyl estradiol-iron (MICROGESTIN FE,GILDESS FE,LOESTRIN FE) 1.5-30 MG-MCG tablet Take 1 tablet by mouth daily.    Marland Kitchen omeprazole (PRILOSEC OTC) 20 MG tablet Take 20 mg by mouth daily.    . traZODone (DESYREL) 50 MG tablet TAKE 1/2-1 TABLET BY MOUTH AT BEDTIME FOR SLEEP 90 tablet 3  . venlafaxine XR (EFFEXOR-XR) 150 MG 24 hr capsule TAKE 1 CAPSULE BY MOUTH DAILY WITH BREAKFAST. 90 capsule 0   No current facility-administered medications on file prior to visit.    ROS as in subjective  Objective: BP 132/74   Pulse (!) 104   Wt 207 lb 6.4 oz (94.1 kg)   SpO2 98%   BMI 37.93 kg/m   Gen; wd, wn, nad Psych:  pleasant, good eye contact, answers questions appropriately   Assessment; Encounter Diagnoses  Name Primary?  . Generalized anxiety disorder Yes  . Insomnia, unspecified type   . Work-related stress   . Family history of sleep apnea   . Rheumatoid arthritis, involving unspecified site, unspecified rheumatoid factor presence ([place])      Plan: Discussed her concerns, stressors, coping skills.   C/t same medications, Effexor, trazodone for sleep, xanax as needed for anxiety.    C/t counseling  Work on goals, get back to swimming and routine exercise, stress reduction.  We had her sign to try and get a copy of her recent labs from gynecology and rheumatology  F/u 4-6 mo, sooner prn

## 2016-12-17 ENCOUNTER — Telehealth: Payer: Self-pay

## 2016-12-17 NOTE — Telephone Encounter (Signed)
Immunization record and copy of EKG placed in your folder from Patient Care in [place] for review. Tamara Vega

## 2016-12-28 ENCOUNTER — Encounter: Payer: Self-pay | Admitting: Medical

## 2017-01-11 ENCOUNTER — Telehealth: Payer: Self-pay | Admitting: Internal Medicine

## 2017-01-11 ENCOUNTER — Other Ambulatory Visit: Payer: Self-pay | Admitting: Medical

## 2017-01-11 MED ORDER — VENLAFAXINE HCL ER 150 MG PO CP24: ORAL_CAPSULE | ORAL | 1 refills | Status: DC

## 2017-01-11 NOTE — Telephone Encounter (Signed)
Refill request for Venlafaxine ER 150mg  #90 to [facility]

## 2017-01-11 NOTE — Telephone Encounter (Signed)
Med sent.

## 2017-01-17 ENCOUNTER — Encounter: Payer: Self-pay | Admitting: Medical

## 2017-03-05 ENCOUNTER — Encounter: Payer: Self-pay | Admitting: Family Medicine

## 2017-03-05 ENCOUNTER — Ambulatory Visit (INDEPENDENT_AMBULATORY_CARE_PROVIDER_SITE_OTHER): Payer: 59 | Admitting: Family Medicine

## 2017-03-05 VITALS — BP 120/80 | HR 101 | Temp 98.3°F | Resp 16 | Wt 196.6 lb

## 2017-03-05 DIAGNOSIS — J302 Other seasonal allergic rhinitis: Secondary | ICD-10-CM | POA: Diagnosis not present

## 2017-03-05 DIAGNOSIS — J0140 Acute pansinusitis, unspecified: Secondary | ICD-10-CM

## 2017-03-05 MED ORDER — AMOXICILLIN 875 MG PO TABS: 875.0000 mg | ORAL_TABLET | Freq: Two times a day (BID) | ORAL | 0 refills | Status: DC

## 2017-03-05 NOTE — Progress Notes (Signed)
Subjective:  Tamara Vega is a 31 y.o. female who presents for 6 day history of nasal congestion, sinus pressure, sore and itchy throat, itchy ears, watery eyes, body aches, post nasal drainage.   History of seasonal allergies that are generally well controlled with Allegra. She has not been using a nasal spray as she typically does in the spring.  History of sinus infections.  Takes Humira for RA.   No recent antibiotics.   Denies fever, chest pain, shortness of breath, wheezing, abdominal pain, N/V/D.   Treatment to date: Mucinex, allegra, Sudafed, tylenol.  Denies sick contacts.  No other aggravating or relieving factors.  No other c/o.  ROS as in subjective.   Objective: Vitals:   03/05/17 0820  BP: 120/80  Pulse: (!) 101  Resp: 16  Temp: 98.3 F (36.8 C)    General appearance: Alert, WD/WN, no distress, mildly ill appearing                             Skin: warm, no rash                           Head: + frontal and maxillary sinus tenderness L>R                            Eyes: conjunctiva normal, corneas clear, PERRLA                            Ears: pearly TMs, external ear canals normal                          Nose: septum midline, turbinates swollen, with erythema and thick discharge             Mouth/throat: MMM, tongue normal, mild pharyngeal erythema without edema or exudate.                            Neck: supple, no adenopathy, no thyromegaly, nontender                          Heart: RRR, normal S1, S2, no murmurs                         Lungs: CTA bilaterally, no wheezes, rales, or rhonchi      Assessment: Acute pansinusitis, recurrence not specified  Seasonal allergic rhinitis, unspecified chronicity, unspecified trigger   Plan: Discussed diagnosis and treatment of acute sinusitis and seasonal allergies. Amoxicillin prescription given to patient per her request.   Suggested symptomatic OTC remedies. Recommend starting on nasal spray for allergies  (Flonase or rhinocort or nasocort. Nasal saline spray for congestion.  Tylenol or Ibuprofen OTC for fever and malaise.  Call/return if not back to baseline after completing the antibiotic.

## 2017-03-05 NOTE — Patient Instructions (Addendum)
Use Flonase or Rhinocort, or nasocort. Treat your underlying allergies and stay well hydrated.  If you are not back to baseline when you finish the antibiotic let us know.    Sinusitis, Adult Sinusitis is soreness and inflammation of your sinuses. Sinuses are hollow spaces in the bones around your face. Your sinuses are located:  Around your eyes.  In the middle of your forehead.  Behind your nose.  In your cheekbones. Your sinuses and nasal passages are lined with a stringy fluid (mucus). Mucus normally drains out of your sinuses. When your nasal tissues become inflamed or swollen, the mucus can become trapped or blocked so air cannot flow through your sinuses. This allows bacteria, viruses, and funguses to grow, which leads to infection. Sinusitis can develop quickly and last for 7?10 days (acute) or for more than 12 weeks (chronic). Sinusitis often develops after a cold. What are the causes? This condition is caused by anything that creates swelling in the sinuses or stops mucus from draining, including:  Allergies.  Asthma.  Bacterial or viral infection.  Abnormally shaped bones between the nasal passages.  Nasal growths that contain mucus (nasal polyps).  Narrow sinus openings.  Pollutants, such as chemicals or irritants in the air.  A foreign object stuck in the nose.  A fungal infection. This is rare. What increases the risk? The following factors may make you more likely to develop this condition:  Having allergies or asthma.  Having had a recent cold or respiratory tract infection.  Having structural deformities or blockages in your nose or sinuses.  Having a weak immune system.  Doing a lot of swimming or diving.  Overusing nasal sprays.  Smoking. What are the signs or symptoms? The main symptoms of this condition are pain and a feeling of pressure around the affected sinuses. Other symptoms include:  Upper toothache.  Earache.  Headache.  Bad  breath.  Decreased sense of smell and taste.  A cough that may get worse at night.  Fatigue.  Fever.  Thick drainage from your nose. The drainage is often green and it may contain pus (purulent).  Stuffy nose or congestion.  Postnasal drip. This is when extra mucus collects in the throat or back of the nose.  Swelling and warmth over the affected sinuses.  Sore throat.  Sensitivity to light. How is this diagnosed? This condition is diagnosed based on symptoms, a medical history, and a physical exam. To find out if your condition is acute or chronic, your health care provider may:  Look in your nose for signs of nasal polyps.  Tap over the affected sinus to check for signs of infection.  View the inside of your sinuses using an imaging device that has a light attached (endoscope). If your health care provider suspects that you have chronic sinusitis, you may also:  Be tested for allergies.  Have a sample of mucus taken from your nose (nasal culture) and checked for bacteria.  Have a mucus sample examined to see if your sinusitis is related to an allergy. If your sinusitis does not respond to treatment and it lasts longer than 8 weeks, you may have an MRI or CT scan to check your sinuses. These scans also help to determine how severe your infection is. In rare cases, a bone biopsy may be done to rule out more serious types of fungal sinus disease. How is this treated? Treatment for sinusitis depends on the cause and whether your condition is chronic or acute.  If a virus is causing your sinusitis, your symptoms will go away on their own within 10 days. You may be given medicines to relieve your symptoms, including:  Topical nasal decongestants. They shrink swollen nasal passages and let mucus drain from your sinuses.  Antihistamines. These drugs block inflammation that is triggered by allergies. This can help to ease swelling in your nose and sinuses.  Topical nasal  corticosteroids. These are nasal sprays that ease inflammation and swelling in your nose and sinuses.  Nasal saline washes. These rinses can help to get rid of thick mucus in your nose. If your condition is caused by bacteria, you will be given an antibiotic medicine. If your condition is caused by a fungus, you will be given an antifungal medicine. Surgery may be needed to correct underlying conditions, such as narrow nasal passages. Surgery may also be needed to remove polyps. Follow these instructions at home: Medicines   Take, use, or apply over-the-counter and prescription medicines only as told by your health care provider. These may include nasal sprays.  If you were prescribed an antibiotic medicine, take it as told by your health care provider. Do not stop taking the antibiotic even if you start to feel better. Hydrate and Humidify   Drink enough water to keep your urine clear or pale yellow. Staying hydrated will help to thin your mucus.  Use a cool mist humidifier to keep the humidity level in your home above 50%.  Inhale steam for 10-15 minutes, 3-4 times a day or as told by your health care provider. You can do this in the bathroom while a hot shower is running.  Limit your exposure to cool or dry air. Rest   Rest as much as possible.  Sleep with your head raised (elevated).  Make sure to get enough sleep each night. General instructions   Apply a warm, moist washcloth to your face 3-4 times a day or as told by your health care provider. This will help with discomfort.  Wash your hands often with soap and water to reduce your exposure to viruses and other germs. If soap and water are not available, use hand sanitizer.  Do not smoke. Avoid being around people who are smoking (secondhand smoke).  Keep all follow-up visits as told by your health care provider. This is important. Contact a health care provider if:  You have a fever.  Your symptoms get worse.  Your  symptoms do not improve within 10 days. Get help right away if:  You have a severe headache.  You have persistent vomiting.  You have pain or swelling around your face or eyes.  You have vision problems.  You develop confusion.  Your neck is stiff.  You have trouble breathing. This information is not intended to replace advice given to you by your health care provider. Make sure you discuss any questions you have with your health care provider. Document Released: 11/19/2005 Document Revised: 07/15/2016 Document Reviewed: 09/14/2015 Elsevier Interactive Patient Education  2017 [employer].

## 2017-03-20 ENCOUNTER — Encounter: Payer: Self-pay | Admitting: Medical

## 2017-06-18 ENCOUNTER — Other Ambulatory Visit: Payer: Self-pay | Admitting: Medical

## 2017-06-18 ENCOUNTER — Telehealth: Payer: Self-pay | Admitting: Medical

## 2017-06-18 NOTE — Telephone Encounter (Signed)
I refilled Trazodone per refill request today. Lets schedule her for yearly physical

## 2017-06-18 NOTE — Telephone Encounter (Signed)
Can pt have a refill on this 

## 2017-06-18 NOTE — Telephone Encounter (Signed)
Called and left VM that rx was send to the pharmacy and that she needs to schedule  A cpe

## 2017-08-04 ENCOUNTER — Other Ambulatory Visit: Payer: Self-pay | Admitting: Medical

## 2017-08-06 NOTE — Telephone Encounter (Signed)
Can pt have a refill on meds  

## 2017-08-07 NOTE — Telephone Encounter (Signed)
Call out #[phone], no refill, schedule for CPX now.

## 2017-09-11 ENCOUNTER — Other Ambulatory Visit: Payer: Self-pay | Admitting: Medical

## 2017-09-11 NOTE — Telephone Encounter (Signed)
Can pt have a refill on this 

## 2017-09-11 NOTE — Telephone Encounter (Signed)
Needs CPX before any refill after this one

## 2017-09-23 ENCOUNTER — Other Ambulatory Visit: Payer: Self-pay | Admitting: Medical

## 2017-09-23 NOTE — Telephone Encounter (Signed)
Can pt have a refill on this 

## 2017-09-24 NOTE — Telephone Encounter (Signed)
Send refill, scheduled CPX

## 2017-10-15 DIAGNOSIS — M0579 Rheumatoid arthritis with rheumatoid factor of multiple sites without organ or systems involvement: Secondary | ICD-10-CM | POA: Diagnosis not present

## 2017-10-15 DIAGNOSIS — Z79899 Other long term (current) drug therapy: Secondary | ICD-10-CM | POA: Diagnosis not present

## 2017-10-21 ENCOUNTER — Encounter: Payer: Self-pay | Admitting: Medical

## 2017-10-21 ENCOUNTER — Ambulatory Visit: Payer: 59 | Admitting: Medical

## 2017-10-21 VITALS — BP 150/90 | HR 110 | Wt 197.2 lb

## 2017-10-21 DIAGNOSIS — R51 Headache: Secondary | ICD-10-CM

## 2017-10-21 DIAGNOSIS — F411 Generalized anxiety disorder: Secondary | ICD-10-CM

## 2017-10-21 DIAGNOSIS — Z566 Other physical and mental strain related to work: Secondary | ICD-10-CM

## 2017-10-21 DIAGNOSIS — R000 Tachycardia, unspecified: Secondary | ICD-10-CM | POA: Diagnosis not present

## 2017-10-21 DIAGNOSIS — R519 Headache, unspecified: Secondary | ICD-10-CM | POA: Insufficient documentation

## 2017-10-21 DIAGNOSIS — I10 Essential (primary) hypertension: Secondary | ICD-10-CM | POA: Diagnosis not present

## 2017-10-21 DIAGNOSIS — R0683 Snoring: Secondary | ICD-10-CM | POA: Insufficient documentation

## 2017-10-21 DIAGNOSIS — M069 Rheumatoid arthritis, unspecified: Secondary | ICD-10-CM | POA: Diagnosis not present

## 2017-10-21 LAB — POCT URINALYSIS DIP (PROADVANTAGE DEVICE)
BILIRUBIN UA: NEGATIVE mg/dL
Bilirubin, UA: NEGATIVE
Blood, UA: NEGATIVE
Glucose, UA: NEGATIVE mg/dL
Leukocytes, UA: NEGATIVE
Nitrite, UA: NEGATIVE
PROTEIN UA: NEGATIVE mg/dL
SPECIFIC GRAVITY, URINE: 1.015
Urobilinogen, Ur: NEGATIVE
pH, UA: 8 (ref 5.0–8.0)

## 2017-10-21 LAB — TSH: TSH: 2.14 m[IU]/L

## 2017-10-21 MED ORDER — ATENOLOL 25 MG PO TABS: 25.0000 mg | ORAL_TABLET | Freq: Every day | ORAL | 2 refills | Status: DC

## 2017-10-21 NOTE — Progress Notes (Signed)
Subjective: Chief Complaint  Patient presents with  . high b/p reading    high b/p readinfg running in 166/117 x 8 weeks    Here for elevated BPs.   She recently started Simponi Aria infusions for RA.  After her fist infusion, she had 2nd infusion 4 weeks later, and is suppose to get them every 8 weeks.   However, on the last 2 infusions her BPs have been quite elevated.   She purchased a BP cuff, has been checking her BPs.  Last week 166/117, 164/114, 162/104.  She has been having some headaches, blurred vision at times in recent weeks about the same time frame the infusions were started.   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  Denies chest pain, SOB, palpitations, edema.  She does snore, but no witnessed apnea.  Father has hx/o OSA.     Family hx/o: Mother's parents both with history of MIs Maternal uncle MI both parents HTN  She had recent labs, 08/2017 CMET, CBC  Past Medical History:  Diagnosis Date  . Chronic headache   . Family history of diabetes mellitus   . Generalized anxiety disorder   . GERD (gastroesophageal reflux disease)   . High risk medication use    Humira, PPI  . Insomnia   . Obesity   . Rheumatoid arthritis ([place]) 04/2015   started Humira 04/2016; Dr. Gavin Pound  . Seasonal allergies    Current Outpatient Medications on File Prior to Visit  Medication Sig Dispense Refill  . ALPRAZolam (XANAX) 0.5 MG tablet TAKE 1 TABLET AT BEDTIME AS NEEDED 60 tablet 0  . Ascorbic Acid (VITAMIN C) 100 MG tablet Take 100 mg daily by mouth.    . Cranberry 1000 MG CAPS Take by mouth.    . folic acid (FOLVITE) 1 MG tablet Take 1 mg daily by mouth.  3  . hydroxychloroquine (PLAQUENIL) 200 MG tablet Take 2 tablets by mouth daily.    . methotrexate (RHEUMATREX) 2.5 MG tablet 4 TABLETS AS DIRECTED ONCE WEEKLY ORALLY 30 DAYS  2  .  mometasone (NASONEX) 50 MCG/ACT nasal spray Place 2 sprays into the nose daily. 17 g 0  . Multiple Vitamins-Minerals (MULTIVITAMIN WITH MINERALS) tablet Take 1 tablet by mouth daily.    . norethindrone-ethinyl estradiol-iron (MICROGESTIN FE,GILDESS FE,LOESTRIN FE) 1.5-30 MG-MCG tablet Take 1 tablet by mouth daily.    Marland Kitchen omeprazole (PRILOSEC OTC) 20 MG tablet Take 20 mg by mouth daily.    . traZODone (DESYREL) 50 MG tablet TAKE 1/2-1 TABLET BY MOUTH AT BEDTIME FOR SLEEP 90 tablet 0  . venlafaxine XR (EFFEXOR-XR) 150 MG 24 hr capsule TAKE 1 CAPSULE BY MOUTH DAILY WITH BREAKFAST. 90 capsule 0  . vitamin B-12 (CYANOCOBALAMIN) 100 MCG tablet Take 100 mcg daily by mouth.     No current facility-administered medications on file prior to visit.     Family History  Problem Relation Age of Onset  . Diabetes Father   . Hypertension Father   . High Cholesterol Father   . Diabetes Mother   . Hypertension Mother   . High Cholesterol Mother   . Heart disease Maternal Uncle   . Heart disease Maternal Grandmother   . Heart disease Maternal Grandfather   . Cancer Neg Hx       Objective: BP (!) 150/90  Pulse (!) 110   Wt 197 lb 3.2 oz (89.4 kg)   SpO2 97%   BMI 36.07 kg/m   Wt Readings from Last 3 Encounters:  10/21/17 197 lb 3.2 oz (89.4 kg)  03/05/17 196 lb 9.6 oz (89.2 kg)  12/06/16 207 lb 6.4 oz (94.1 kg)    General appearance: alert, no distress, WD/WN,  HEENT: normocephalic, sclerae anicteric, TMs pearly, nares patent, no discharge or erythema, pharynx normal Oral cavity: MMM, no lesions Neck: supple, no lymphadenopathy, no thyromegaly, no masses, no bruits Heart: RRR, normal S1, S2, no murmurs Lungs: CTA bilaterally, no wheezes, rhonchi, or rales Pulses: 2+ symmetric, upper and lower extremities, normal cap refill Ext: no edema Neuro: CN2-12 intact, nonfocal exam   Adult ECG Report  Indication: HTN  Rate: 101 bpm  Rhythm: sinus tachycardia  QRS Axis: 24 degrees  PR  Interval: 128 ms  QRS Duration: 2ms  QTc: 454ms  Conduction Disturbances: none  Other Abnormalities: none  Patient's cardiac risk factors are: hypertension and obesity (BMI >= 30 kg/m2).  EKG comparison: none  Narrative Interpretation: sinus tachycardia    Assessment: Encounter Diagnoses  Name Primary?  . Essential hypertension Yes  . Tachycardia   . Rheumatoid arthritis, involving unspecified site, unspecified rheumatoid factor presence ([place])   . Generalized anxiety disorder   . Work-related stress   . Generalized headaches   . Snoring      Plan: HTN , tachycardia - reviewed EKG.  Discussed diagnosis, possible contributing factors.  I recommend sleep study.  She will check insurance coverage for this.  Begin trial of Atenolol.  discussed risks/benefits of medication.  She has a home monitor that she will check BPs at home.  RA - managed by rheumatology  Anxiety - c/t current medications.  Headaches, snoring - consider sleep study to evaluate for sleep apnea.   Of note, father has hx/o OSA.   Tamara Vega was seen today for high b/p reading.  Diagnoses and all orders for this visit:  Essential hypertension -     TSH -     POCT Urinalysis DIP (Proadvantage Device) -     EKG 12-Lead  Tachycardia -     TSH -     EKG 12-Lead  Rheumatoid arthritis, involving unspecified site, unspecified rheumatoid factor presence (H[facility])  Generalized anxiety disorder  Work-related stress  Generalized headaches  Snoring  Other orders -     atenolol (TENORMIN) 25 MG tablet; Take 1 tablet (25 mg total) daily by mouth.

## 2017-11-07 DIAGNOSIS — M0579 Rheumatoid arthritis with rheumatoid factor of multiple sites without organ or systems involvement: Secondary | ICD-10-CM | POA: Diagnosis not present

## 2017-11-07 DIAGNOSIS — M2550 Pain in unspecified joint: Secondary | ICD-10-CM | POA: Diagnosis not present

## 2017-11-07 DIAGNOSIS — Z79899 Other long term (current) drug therapy: Secondary | ICD-10-CM | POA: Diagnosis not present

## 2017-11-28 ENCOUNTER — Other Ambulatory Visit: Payer: Self-pay | Admitting: Medical

## 2017-11-29 NOTE — Telephone Encounter (Signed)
Call out xanax, send the other

## 2017-11-29 NOTE — Telephone Encounter (Signed)
Can pt have a refill on meds  

## 2017-12-02 ENCOUNTER — Encounter: Payer: Self-pay | Admitting: Medical

## 2017-12-04 ENCOUNTER — Other Ambulatory Visit: Payer: Self-pay | Admitting: Medical

## 2017-12-04 MED ORDER — HYDROCHLOROTHIAZIDE 25 MG PO TABS: 25.0000 mg | ORAL_TABLET | Freq: Every day | ORAL | 0 refills | Status: DC

## 2017-12-04 MED ORDER — ATENOLOL 50 MG PO TABS: 50.0000 mg | ORAL_TABLET | Freq: Every day | ORAL | 2 refills | Status: DC

## 2017-12-10 DIAGNOSIS — M0579 Rheumatoid arthritis with rheumatoid factor of multiple sites without organ or systems involvement: Secondary | ICD-10-CM | POA: Diagnosis not present

## 2017-12-10 DIAGNOSIS — Z79899 Other long term (current) drug therapy: Secondary | ICD-10-CM | POA: Diagnosis not present

## 2017-12-30 ENCOUNTER — Other Ambulatory Visit: Payer: Self-pay | Admitting: Medical

## 2017-12-30 NOTE — Telephone Encounter (Signed)
Is this okay to refill? 

## 2018-02-04 DIAGNOSIS — M0579 Rheumatoid arthritis with rheumatoid factor of multiple sites without organ or systems involvement: Secondary | ICD-10-CM | POA: Diagnosis not present

## 2018-02-07 ENCOUNTER — Other Ambulatory Visit: Payer: Self-pay | Admitting: Medical

## 2018-02-07 DIAGNOSIS — M0579 Rheumatoid arthritis with rheumatoid factor of multiple sites without organ or systems involvement: Secondary | ICD-10-CM | POA: Diagnosis not present

## 2018-02-07 DIAGNOSIS — M2550 Pain in unspecified joint: Secondary | ICD-10-CM | POA: Diagnosis not present

## 2018-02-07 DIAGNOSIS — Z79899 Other long term (current) drug therapy: Secondary | ICD-10-CM | POA: Diagnosis not present

## 2018-03-01 ENCOUNTER — Other Ambulatory Visit: Payer: Self-pay | Admitting: Medical

## 2018-03-03 NOTE — Telephone Encounter (Signed)
Please advise LOV 10/21/17, no BP check office visits.   Okay to refill?  Thanks!

## 2018-03-11 ENCOUNTER — Other Ambulatory Visit: Payer: Self-pay | Admitting: Medical

## 2018-03-11 NOTE — Telephone Encounter (Signed)
Please advise. Thank you

## 2018-04-01 DIAGNOSIS — Z79899 Other long term (current) drug therapy: Secondary | ICD-10-CM | POA: Diagnosis not present

## 2018-04-01 DIAGNOSIS — M0579 Rheumatoid arthritis with rheumatoid factor of multiple sites without organ or systems involvement: Secondary | ICD-10-CM | POA: Diagnosis not present

## 2018-04-04 ENCOUNTER — Other Ambulatory Visit: Payer: Self-pay | Admitting: Medical

## 2018-04-04 NOTE — Telephone Encounter (Signed)
Send 30 day supply and get her in for physical

## 2018-04-04 NOTE — Telephone Encounter (Signed)
Tried contacting patient but she hung up.

## 2018-04-21 ENCOUNTER — Other Ambulatory Visit: Payer: Self-pay | Admitting: Medical

## 2018-05-01 ENCOUNTER — Ambulatory Visit (INDEPENDENT_AMBULATORY_CARE_PROVIDER_SITE_OTHER): Payer: BLUE CROSS/BLUE SHIELD | Admitting: Medical

## 2018-05-01 ENCOUNTER — Encounter: Payer: Self-pay | Admitting: Medical

## 2018-05-01 VITALS — BP 122/88 | HR 72 | Resp 16 | Ht 62.5 in | Wt 207.6 lb

## 2018-05-01 DIAGNOSIS — G4700 Insomnia, unspecified: Secondary | ICD-10-CM | POA: Diagnosis not present

## 2018-05-01 DIAGNOSIS — Z566 Other physical and mental strain related to work: Secondary | ICD-10-CM

## 2018-05-01 DIAGNOSIS — I10 Essential (primary) hypertension: Secondary | ICD-10-CM

## 2018-05-01 DIAGNOSIS — K219 Gastro-esophageal reflux disease without esophagitis: Secondary | ICD-10-CM

## 2018-05-01 DIAGNOSIS — Z833 Family history of diabetes mellitus: Secondary | ICD-10-CM | POA: Diagnosis not present

## 2018-05-01 DIAGNOSIS — F411 Generalized anxiety disorder: Secondary | ICD-10-CM

## 2018-05-01 DIAGNOSIS — Z0000 Encounter for general adult medical examination without abnormal findings: Secondary | ICD-10-CM | POA: Diagnosis not present

## 2018-05-01 DIAGNOSIS — Z79899 Other long term (current) drug therapy: Secondary | ICD-10-CM | POA: Diagnosis not present

## 2018-05-01 DIAGNOSIS — M069 Rheumatoid arthritis, unspecified: Secondary | ICD-10-CM | POA: Diagnosis not present

## 2018-05-01 LAB — POCT URINALYSIS DIP (PROADVANTAGE DEVICE)
BILIRUBIN UA: NEGATIVE
BILIRUBIN UA: NEGATIVE mg/dL
GLUCOSE UA: NEGATIVE mg/dL
Leukocytes, UA: NEGATIVE
Nitrite, UA: NEGATIVE
Protein Ur, POC: NEGATIVE mg/dL
SPECIFIC GRAVITY, URINE: 1.025
Urobilinogen, Ur: NEGATIVE
pH, UA: 6 (ref 5.0–8.0)

## 2018-05-01 NOTE — Progress Notes (Signed)
Subjective:   HPI  Tamara Vega is a 32 y.o. female who presents for a complete physical.  Chief Complaint  Patient presents with  . Annual Exam    CPE  ate handful chick peas at 11   has pap at gyn    eye exam 9-18     Medical care team includes:   Sees Dr. Jess Barters, gynecology  Dr. Gavin Pound, rheumatology Jackie Littlejohn, Camelia Eng, PA-C here for primary care Sees dentist Sees eye doctor   Concerns: Is exercising 5 days per week, walks/jogs.   Doing up to 6 miles 2-3 time per week, fitness class twice weekly.   Doing overal much better with her health and is less pain with her infusions from Rheumatology for RA.   Insomnia - prior to this month was doing fine, but this month only getting 4 hours sleep per night.   Still uses 1/4 -1/2 trazodone, but not if working too late as it can cause next morning sedation  Using xanax prn.  Reviewed their medical, surgical, family, social, medication, and allergy history and updated chart as appropriate.  Past Medical History:  Diagnosis Date  . Chronic headache   . Family history of diabetes mellitus   . Generalized anxiety disorder   . GERD (gastroesophageal reflux disease)   . High risk medication use    Humira, PPI  . Hypertension   . Insomnia   . Obesity   . Rheumatoid arthritis ([place]) 04/2015   started Humira 04/2016; Dr. Gavin Pound  . Seasonal allergies     Past Surgical History:  Procedure Laterality Date  . NO PAST SURGERIES     04/2018    Social History   Socioeconomic History  . Marital status: Single    Spouse name: Not on file  . Number of children: Not on file  . Years of education: Not on file  . Highest education level: Not on file  Occupational History  . Not on file  Social Needs  . Financial resource strain: Not on file  . Food insecurity:    Worry: Not on file    Inability: Not on file  . Transportation needs:    Medical: Not on file    Non-medical: Not on file  Tobacco Use  . Smoking status:  Never Smoker  . Smokeless tobacco: Never Used  Substance and Sexual Activity  . Alcohol use: Yes    Alcohol/week: 0.6 oz    Types: 1 Glasses of wine per week  . Drug use: No  . Sexual activity: Not on file  Lifestyle  . Physical activity:    Days per week: Not on file    Minutes per session: Not on file  . Stress: Not on file  Relationships  . Social connections:    Talks on phone: Not on file    Gets together: Not on file    Attends religious service: Not on file    Active member of club or organization: Not on file    Attends meetings of clubs or organizations: Not on file    Relationship status: Not on file  . Intimate partner violence:    Fear of current or ex partner: Not on file    Emotionally abused: Not on file    Physically abused: Not on file    Forced sexual activity: Not on file  Other Topics Concern  . Not on file  Social History Narrative   Single, attorney, self employed 2019, was formerly at [employer].   Walking for exercise, 15-20 minutes daily.   Plans to get back to swimming again soon.  Works many hours.   No significant other currently.  As of 04/2018    Family History  Problem Relation Age of Onset  . Diabetes Father   . Hypertension Father   . High Cholesterol Father   . Diabetes Mother   . Hypertension Mother   . High Cholesterol Mother   . Heart disease Maternal Uncle   . Heart disease Maternal Grandmother   . Heart disease Maternal Grandfather   . Cancer Neg Hx      Current Outpatient Medications:  .  ALPRAZolam (XANAX) 0.5 MG tablet, TAKE 1 TABLET BY MOUTH AS NEEDED FOR SLEEP, Disp: 60 tablet, Rfl: 1 .  APPLE CIDER VINEGAR PO, Take by mouth., Disp: , Rfl:  .  Ascorbic Acid (VITAMIN C) 100 MG tablet, Take 100 mg daily by mouth., Disp: , Rfl:  .  atenolol (TENORMIN) 50 MG tablet, TAKE 1 TABLET BY MOUTH EVERY DAY, Disp: 90 tablet, Rfl: 0 .  Biotin 10000 MCG TBDP, Take by mouth., Disp: , Rfl:  .  Cranberry 1000 MG CAPS, Take by mouth.,  Disp: , Rfl:  .  folic acid (FOLVITE) 1 MG tablet, Take 1 mg daily by mouth., Disp: , Rfl: 3 .  Golimumab (SIMPONI ARIA IV), Inject into the vein., Disp: , Rfl:  .  hydrochlorothiazide (HYDRODIURIL) 25 MG tablet, TAKE 1 TABLET BY MOUTH EVERY DAY, Disp: 90 tablet, Rfl: 0 .  hydroxychloroquine (PLAQUENIL) 200 MG tablet, Take 2 tablets by mouth daily., Disp: , Rfl:  .  methotrexate (RHEUMATREX) 2.5 MG tablet, 4 TABLETS AS DIRECTED ONCE WEEKLY ORALLY 30 DAYS, Disp: , Rfl: 2 .  Multiple Vitamins-Minerals (MULTIVITAMIN WITH MINERALS) tablet, Take 1 tablet by mouth daily., Disp: , Rfl:  .  norethindrone-ethinyl estradiol-iron (MICROGESTIN FE,GILDESS FE,LOESTRIN FE) 1.5-30 MG-MCG tablet, Take 1 tablet by mouth daily., Disp: , Rfl:  .  omeprazole (PRILOSEC OTC) 20 MG tablet, Take 20 mg by mouth daily., Disp: , Rfl:  .  traZODone (DESYREL) 50 MG tablet, TAKE 1/2-1 TABLET BY MOUTH AT BEDTIME FOR SLEEP, Disp: 30 tablet, Rfl: 0 .  venlafaxine XR (EFFEXOR-XR) 150 MG 24 hr capsule, TAKE 1 CAPSULE BY MOUTH DAILY WITH BREAKFAST., Disp: 90 capsule, Rfl: 1 .  vitamin B-12 (CYANOCOBALAMIN) 100 MCG tablet, Take 100 mcg daily by mouth., Disp: , Rfl:  .  mometasone (NASONEX) 50 MCG/ACT nasal spray, Place 2 sprays into the nose daily. (Patient not taking: Reported on 05/01/2018), Disp: 17 g, Rfl: 0  Allergies  Allergen Reactions  . Bactrim [Sulfamethoxazole-Trimethoprim] Swelling    reddness and itching   . Diclofenac     Blood in stool once 2016    Review of Systems Constitutional: -fever, -chills, -sweats, -unexpected weight change, -decreased appetite, -fatigue Allergy: -sneezing, -itching, -congestion Dermatology: -changing moles, --rash, -lumps ENT: -runny nose, -ear pain, -sore throat, -hoarseness, -sinus pain, -teeth pain, - ringing in ears, -hearing loss, -nosebleeds Cardiology: -chest pain, -palpitations, -swelling, -difficulty breathing when lying flat, -waking up short of breath Respiratory: -cough,  -shortness of breath, -difficulty breathing with exercise or exertion, -wheezing, -coughing up blood Gastroenterology: -abdominal pain, -nausea, -vomiting, -diarrhea, -constipation, -blood in stool, -changes in bowel movement, -difficulty swallowing or eating Hematology: -bleeding, -bruising  Musculoskeletal: -joint aches, -muscle aches, +joint swelling, -back pain, -neck pain, -cramping, -changes in gait Ophthalmology: denies vision changes, eye redness, itching, discharge Urology: -burning with urination, -difficulty urinating, -blood  in urine, -urinary frequency, -urgency, -incontinence Neurology: -headache, -weakness, -tingling, -numbness, -memory loss, -falls, -dizziness Psychology: -depressed mood, -agitation, +sleep problems     Objective:   Physical Exam  BP 122/88   Pulse 72   Resp 16   Ht 5' 2.5" (1.588 m)   Wt 207 lb 9.6 oz (94.2 kg)   SpO2 97%   BMI 37.37 kg/m   BP Readings from Last 3 Encounters:  05/01/18 122/88  10/21/17 (!) 150/90  03/05/17 120/80   Wt Readings from Last 3 Encounters:  05/01/18 207 lb 9.6 oz (94.2 kg)  10/21/17 197 lb 3.2 oz (89.4 kg)  03/05/17 196 lb 9.6 oz (89.2 kg)    General appearance: alert, no distress, WD/WN, [place] female Skin: scattered macules, no worrisome lesions HEENT: normocephalic, conjunctiva/corneas normal, sclerae anicteric, PERRLA, EOMi, nares patent, no discharge or erythema, pharynx normal Oral cavity: MMM, tongue normal, teeth normal Neck: supple, no lymphadenopathy, no thyromegaly, no masses, normal ROM, no bruits Chest: non tender, normal shape and expansion Heart: RRR, normal S1, S2, no murmurs Lungs: CTA bilaterally, no wheezes, rhonchi, or rales Abdomen: +bs, soft, non tender, non distended, no masses, no hepatomegaly, no splenomegaly, no bruits Back: non tender, normal ROM, no scoliosis Musculoskeletal: upper extremities non tender, no obvious deformity, normal ROM throughout, lower extremities non tender, no  obvious deformity, normal ROM throughout Extremities: no edema, no cyanosis, no clubbing Pulses: 2+ symmetric, upper and lower extremities, normal cap refill Neurological: alert, oriented x 3, CN2-12 intact, strength normal upper extremities and lower extremities, sensation normal throughout, DTRs 2+ throughout, no cerebellar signs, gait normal Psychiatric: normal affect, behavior normal, pleasant  Breast/gyn/rectal - deferred to gyn    Assessment and Plan :    Encounter Diagnoses  Name Primary?  . Encounter for health maintenance examination in adult Yes  . Essential hypertension   . Gastroesophageal reflux disease without esophagitis   . Rheumatoid arthritis, involving unspecified site, unspecified rheumatoid factor presence ([place])   . Generalized anxiety disorder   . Insomnia, unspecified type   . Work-related stress   . Family history of diabetes mellitus   . High risk medication use    Physical exam - discussed healthy lifestyle, diet, exercise, preventative care, vaccinations, and addressed their concerns.   See your eye doctor yearly for routine vision care. See your dentist yearly for routine dental care including hygiene visits twice yearly. See your gynecologist yearly for routine gynecological care. RA - f/u with rheumatology q48mo C/t current medications for anxiety, insomnia, c/t to work on stress management, balance in life, getting routine exercise I reviewed recent CBC and CMET labs from 03/2018 on her phone.  The only abnormality was slightly elevated platelets.   Work on healthy lifestyle for weight loss.   [place] was seen today for annual exam.  Diagnoses and all orders for this visit:  Encounter for health maintenance examination in adult -     POCT Urinalysis DIP (Proadvantage Device) -     Lipid panel -     Hemoglobin A1c  Essential hypertension  Gastroesophageal reflux disease without esophagitis  Rheumatoid arthritis, involving unspecified site,  unspecified rheumatoid factor presence ([facility])  Generalized anxiety disorder  Insomnia, unspecified type  Work-related stress  Family history of diabetes mellitus  High risk medication use

## 2018-05-02 ENCOUNTER — Other Ambulatory Visit: Payer: Self-pay | Admitting: Medical

## 2018-05-02 LAB — LIPID PANEL
CHOL/HDL RATIO: 2.6 ratio (ref 0.0–4.4)
Cholesterol, Total: 204 mg/dL — ABNORMAL HIGH (ref 100–199)
HDL: 80 mg/dL (ref >39)
LDL CALC: 104 mg/dL — AB (ref 0–99)
TRIGLYCERIDES: 99 mg/dL (ref 0–149)
VLDL CHOLESTEROL CAL: 20 mg/dL (ref 5–40)

## 2018-05-02 LAB — HEMOGLOBIN A1C
Est. average glucose Bld gHb Est-mCnc: 114 mg/dL
Hgb A1c MFr Bld: 5.6 % (ref 4.8–5.6)

## 2018-05-16 DIAGNOSIS — M0579 Rheumatoid arthritis with rheumatoid factor of multiple sites without organ or systems involvement: Secondary | ICD-10-CM | POA: Diagnosis not present

## 2018-05-16 DIAGNOSIS — Z79899 Other long term (current) drug therapy: Secondary | ICD-10-CM | POA: Diagnosis not present

## 2018-05-16 DIAGNOSIS — M2550 Pain in unspecified joint: Secondary | ICD-10-CM | POA: Diagnosis not present

## 2018-05-22 ENCOUNTER — Other Ambulatory Visit: Payer: Self-pay | Admitting: Medical

## 2018-05-27 DIAGNOSIS — M0579 Rheumatoid arthritis with rheumatoid factor of multiple sites without organ or systems involvement: Secondary | ICD-10-CM | POA: Diagnosis not present

## 2018-05-27 DIAGNOSIS — Z79899 Other long term (current) drug therapy: Secondary | ICD-10-CM | POA: Diagnosis not present

## 2018-06-06 ENCOUNTER — Other Ambulatory Visit: Payer: Self-pay | Admitting: Medical

## 2018-06-06 NOTE — Telephone Encounter (Signed)
Is this okay to refill? 

## 2018-07-11 ENCOUNTER — Other Ambulatory Visit: Payer: Self-pay | Admitting: Medical

## 2018-07-11 DIAGNOSIS — I10 Essential (primary) hypertension: Secondary | ICD-10-CM

## 2018-07-23 DIAGNOSIS — M0579 Rheumatoid arthritis with rheumatoid factor of multiple sites without organ or systems involvement: Secondary | ICD-10-CM | POA: Diagnosis not present

## 2018-07-24 ENCOUNTER — Other Ambulatory Visit: Payer: Self-pay | Admitting: Medical

## 2018-07-24 NOTE — Telephone Encounter (Signed)
Is this ok to refill?  

## 2018-08-02 ENCOUNTER — Other Ambulatory Visit: Payer: Self-pay | Admitting: Medical

## 2018-08-05 NOTE — Telephone Encounter (Signed)
Is this ok to refill?  

## 2018-08-18 DIAGNOSIS — M791 Myalgia, unspecified site: Secondary | ICD-10-CM | POA: Diagnosis not present

## 2018-08-18 DIAGNOSIS — M2550 Pain in unspecified joint: Secondary | ICD-10-CM | POA: Diagnosis not present

## 2018-08-18 DIAGNOSIS — M0579 Rheumatoid arthritis with rheumatoid factor of multiple sites without organ or systems involvement: Secondary | ICD-10-CM | POA: Diagnosis not present

## 2018-08-18 DIAGNOSIS — Z79899 Other long term (current) drug therapy: Secondary | ICD-10-CM | POA: Diagnosis not present

## 2018-09-02 ENCOUNTER — Other Ambulatory Visit: Payer: Self-pay | Admitting: Medical

## 2018-09-02 DIAGNOSIS — Z113 Encounter for screening for infections with a predominantly sexual mode of transmission: Secondary | ICD-10-CM | POA: Diagnosis not present

## 2018-09-02 DIAGNOSIS — Z01419 Encounter for gynecological examination (general) (routine) without abnormal findings: Secondary | ICD-10-CM | POA: Diagnosis not present

## 2018-09-02 MED ORDER — TIZANIDINE HCL 4 MG PO TABS: 4.0000 mg | ORAL_TABLET | Freq: Three times a day (TID) | ORAL | 0 refills | Status: AC | PRN

## 2018-09-17 DIAGNOSIS — M0579 Rheumatoid arthritis with rheumatoid factor of multiple sites without organ or systems involvement: Secondary | ICD-10-CM | POA: Diagnosis not present

## 2018-09-17 DIAGNOSIS — Z79899 Other long term (current) drug therapy: Secondary | ICD-10-CM | POA: Diagnosis not present

## 2018-09-19 ENCOUNTER — Encounter: Payer: Self-pay | Admitting: Obstetrics & Gynecology

## 2018-09-23 ENCOUNTER — Other Ambulatory Visit: Payer: Self-pay | Admitting: Medical

## 2018-09-23 NOTE — Telephone Encounter (Signed)
Is this ok to refill?  

## 2018-11-07 DIAGNOSIS — Z79899 Other long term (current) drug therapy: Secondary | ICD-10-CM | POA: Diagnosis not present

## 2018-11-12 DIAGNOSIS — Z79899 Other long term (current) drug therapy: Secondary | ICD-10-CM | POA: Diagnosis not present

## 2018-11-12 DIAGNOSIS — M0579 Rheumatoid arthritis with rheumatoid factor of multiple sites without organ or systems involvement: Secondary | ICD-10-CM | POA: Diagnosis not present

## 2018-11-13 ENCOUNTER — Other Ambulatory Visit: Payer: Self-pay | Admitting: Medical

## 2018-11-13 DIAGNOSIS — I10 Essential (primary) hypertension: Secondary | ICD-10-CM

## 2018-11-21 ENCOUNTER — Telehealth: Payer: Self-pay | Admitting: Medical

## 2018-11-21 NOTE — Telephone Encounter (Signed)
Lets get her in for med check, recheck.   FYI Herby Abraham, her counselor, called requesting we try her on Pristiq and lower Effexor step wise while increasing Pristiq.  She is having more anxiety of late, using Xanax more of late despite daily use of Effexor.

## 2018-11-24 NOTE — Telephone Encounter (Signed)
Forwarding to you :)

## 2018-11-24 NOTE — Telephone Encounter (Signed)
Done KH 

## 2018-11-27 ENCOUNTER — Telehealth: Payer: Self-pay | Admitting: Medical

## 2018-11-27 ENCOUNTER — Encounter: Payer: Self-pay | Admitting: Medical

## 2018-11-27 ENCOUNTER — Other Ambulatory Visit: Payer: Self-pay | Admitting: Medical

## 2018-11-27 ENCOUNTER — Ambulatory Visit: Payer: BLUE CROSS/BLUE SHIELD | Admitting: Medical

## 2018-11-27 VITALS — BP 130/84 | HR 80 | Temp 98.3°F | Resp 16 | Ht 62.5 in | Wt 211.0 lb

## 2018-11-27 DIAGNOSIS — Z79899 Other long term (current) drug therapy: Secondary | ICD-10-CM

## 2018-11-27 DIAGNOSIS — M62838 Other muscle spasm: Secondary | ICD-10-CM | POA: Diagnosis not present

## 2018-11-27 DIAGNOSIS — F411 Generalized anxiety disorder: Secondary | ICD-10-CM

## 2018-11-27 DIAGNOSIS — M069 Rheumatoid arthritis, unspecified: Secondary | ICD-10-CM | POA: Diagnosis not present

## 2018-11-27 DIAGNOSIS — G4700 Insomnia, unspecified: Secondary | ICD-10-CM

## 2018-11-27 MED ORDER — CYCLOBENZAPRINE HCL 10 MG PO TABS: 10.0000 mg | ORAL_TABLET | Freq: Every day | ORAL | 0 refills | Status: DC

## 2018-11-27 MED ORDER — ALPRAZOLAM 0.5 MG PO TABS: ORAL_TABLET | ORAL | 2 refills | Status: DC

## 2018-11-27 MED ORDER — VENLAFAXINE HCL ER 150 MG PO CP24: ORAL_CAPSULE | ORAL | 0 refills | Status: DC

## 2018-11-27 NOTE — Telephone Encounter (Signed)
Please call her  I discussed several options with supervising physician Dr. Redmond School.  1- she can use Xanax as needed, up to BID, and possibly TID if really stressful issue, but try to keep at BID 2-if she is already doing this regularly at BID, then we can change to the 1mg  Xanax 3 - Trintellix is an option as this hits other receptors that the Effexor doesn't cover in its mechanism of action.   There is some overlap in the way this medication works compared to Effexor.   We could try adding Trintellix low dose daily and simultaneously lowering Effexor dose a little. 4- another options is increasing the Effexor dose instead of adding a new medication.   She can discuss this with her sister who is a Software engineer, but if she wants to try the Trintellix, I may have samples.     There is no one correct answer, but either of these would be reasonable approaches.

## 2018-11-27 NOTE — Telephone Encounter (Signed)
Ok.  I refilled Effexor and Xanax for BID dosing.   Lets see how she does in the next month.   Consider the other medication options as well.

## 2018-11-27 NOTE — Progress Notes (Signed)
Subjective: Chief Complaint  Patient presents with  . med check    med check   right side neck pain    Here for follow-up on anxiety.  She is seeing Herby Abraham, counselor, and recently Lauren called in recommending we consider changing her to Pristiq as she was not doing as well her current Effexor.  Anxiety has been worse than usual of late.  Still taking Effexor.   She had cut back to seeing counselor once every 3 weeks, but now ramping this back up. She is finding that she is taking xanax more often to breath calmly.  Denies side effects with xanax, seems to get calmer after taking.  Uses this sometimes with difficult court days.   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     Her sister is pharmacist, and discussed medications difference with Pristiq vs Effexor vs other.  She didn't feel it would be all that beneficial.   Gained a lot of weight on Lexapro.  Didn't do well on Celexa.  Klonopin made her feel out of sorts.  No prior Wellbutrin.  Mom says she can be moody.   October, her gynecologist was worried about cells on pap, had pre-cancerous scare, and was having anxiety worse then.  Was using xanax up to BID during that period.  Seeing rheumatology, still getting labs every 8 weeks ,and they are working with gynecology to monitor for worrisome findings, but so far things are stable.  Using Trazodone lately, 1/4  QHS.  She doesn't take if get home too late.  Working late, gets home late in the evening in recent weeks.     In the summer, she accidentally ran out of Effexor, 1 week was off the medication, felt out of sorts, was crying, was upset.  Exercises, does DVD work out daily.  Been having some neck pain from occiput to right upper back for about a week.  Gets massages every other week.  Last week right neck was quite tender, right neck and upper shoulder been giving her problems the past week .   No recent injury, trauma, or fall.  No other aggravating or relieving  factors. No other complaint.    Past Medical History:  Diagnosis Date  . Chronic headache   . Family history of diabetes mellitus   . Generalized anxiety disorder   . GERD (gastroesophageal reflux disease)   . High risk medication use    Humira, PPI  . Hypertension   . Insomnia   . Obesity   . Rheumatoid arthritis ([place]) 04/2015   started Humira 04/2016; Dr. Gavin Pound  . Seasonal allergies    Current Outpatient Medications on File Prior to Visit  Medication Sig Dispense Refill  . ALPRAZolam (XANAX) 0.5 MG tablet TAKE 1 TABLET BY MOUTH AS NEEDED FOR SLEEP 60 tablet 0  . APPLE CIDER VINEGAR PO Take by mouth.    . Ascorbic Acid (VITAMIN C) 100 MG tablet Take 100 mg daily by mouth.    Marland Kitchen atenolol (TENORMIN) 50 MG tablet TAKE 1 TABLET BY MOUTH EVERY DAY 90 tablet 0  . Biotin 10000 MCG TBDP Take 5,000 mg by mouth.     . Cranberry 1000 MG CAPS Take by mouth.    . Fexofenadine-Pseudoephedrine (ALLEGRA-D 24 HOUR PO) Take by mouth.    . folic acid (FOLVITE) 1 MG tablet Take 1 mg by mouth 3 (three) times daily.   3  . Golimumab ([place] ARIA IV) Inject into the vein.    Marland Kitchen  hydrochlorothiazide (HYDRODIURIL) 25 MG tablet TAKE 1 TABLET BY MOUTH EVERY DAY 90 tablet 0  . hydroxychloroquine (PLAQUENIL) 200 MG tablet Take 2 tablets by mouth daily.    . methotrexate (RHEUMATREX) 2.5 MG tablet 4 TABLETS AS DIRECTED ONCE WEEKLY ORALLY 30 DAYS  2  . Multiple Vitamins-Minerals (MULTIVITAMIN WITH MINERALS) tablet Take 1 tablet by mouth daily.    . norethindrone-ethinyl estradiol-iron (MICROGESTIN FE,GILDESS FE,LOESTRIN FE) 1.5-30 MG-MCG tablet Take 1 tablet by mouth daily.    Marland Kitchen omeprazole (PRILOSEC OTC) 20 MG tablet Take 20 mg by mouth daily.    . traZODone (DESYREL) 50 MG tablet TAKE 1/2-1 TABLETS BY MOUTH AT BEDTIME FOR SLEEP 90 tablet 1  . valACYclovir (VALTREX) 500 MG tablet Take 500 mg by mouth 2 (two) times daily.    Marland Kitchen venlafaxine XR (EFFEXOR-XR) 150 MG 24 hr capsule TAKE 1 CAPSULE BY MOUTH DAILY  WITH BREAKFAST. 90 capsule 1  . vitamin B-12 (CYANOCOBALAMIN) 100 MCG tablet Take 100 mcg daily by mouth.    . mometasone (NASONEX) 50 MCG/ACT nasal spray Place 2 sprays into the nose daily. (Patient not taking: Reported on 05/01/2018) 17 g 0   No current facility-administered medications on file prior to visit.    ROS as in subjective    Objective: BP 130/84   Pulse 80   Temp 98.3 F (36.8 C) (Oral)   Resp 16   Ht 5' 2.5" (1.588 m)   Wt 211 lb (95.7 kg)   LMP 08/20/2018 (Exact Date)   SpO2 98%   BMI 37.98 kg/m    Wt Readings from Last 3 Encounters:  11/27/18 211 lb (95.7 kg)  05/01/18 207 lb 9.6 oz (94.2 kg)  10/21/17 197 lb 3.2 oz (89.4 kg)   Gen: wd, wn, nad Skin: unremarkable She is somewhat tender of the base of the right side occiput in neck on the right posteriorly, mild pain with range of motion which is about 90% of normal range of motion in general.  She does note pain with range of motion in general.  There is slight spasm in the right neck.  Otherwise upper back non tender, shoulder is non tender, normal range of motion of shoulders.  She seems to have a fullness in the middle of her upper back in the C7 range suggestive of may be a subcutaneous lipoma.  Finding is somewhat subtle. Arms neurovascularly intact Psych: Pleasant, good eye contact, answers questions appropriately   Assessment: Encounter Diagnoses  Name Primary?  Marland Kitchen Anxiety Yes  . Neck muscle spasm   . Generalized anxiety disorder   . Insomnia, unspecified type   . Rheumatoid arthritis, involving unspecified site, unspecified rheumatoid factor presence ([place])   . High risk medication use      Plan: Generalized anxiety- we discussed her current medications including Effexor XR 150 mg, Xanax 0.5 mg as needed, atenolol 50 mg daily for blood pressure and somewhat for anxiety.  She continues on trazodone 50 mg, 1/4 tablet nightly for sleep most nights unless she gets into late.  We discussed possible  options for modifying her medication since she does not feel like her anxiety is well controlled right now.  She continues to see her counselor regularly, on average every 2 weeks right now.  She is exercising and doing other things to help calm her nerves and deal with her anxiety.  Spoke to supervising physician about some options.    We discussed that she could change Xanax to twice daily as needed, could  possibly increase the dose of Xanax if needed, could try Trintellix added onto Effexor or to gradually wean down on the Effexor as we titrate up on Trintellix.  We discussed increasing the dose of Effexor in general by itself.  We will call her and discuss options.     Neck spasm and neck pain- advised gentle stretching range of motion activity, heat, continue massage, advise she use Aleve once or twice daily just for the next 3 to 4 days, Flexeril nightly to next 2 to 3 days.  If symptoms are not gradually improving over the next 3 to 4 days then let me know  Addendum: After speaking with her via phone, she will increase Xanax dosing to twice daily as needed, continue Effexor, no other med changes at this time but she will consider other options and let me know.  continue counselor  Tamara Vega was seen today for med check.  Diagnoses and all orders for this visit:  Anxiety  Neck muscle spasm  Generalized anxiety disorder  Insomnia, unspecified type  Rheumatoid arthritis, involving unspecified site, unspecified rheumatoid factor presence (HCC)  High risk medication use  Other orders -     cyclobenzaprine (FLEXERIL) 10 MG tablet; Take 1 tablet (10 mg total) by mouth at bedtime.

## 2018-11-27 NOTE — Telephone Encounter (Signed)
Patient states that she would like to try the Xanex BID as she is only taking one a day.  She will talk to her sister and let us know over the next few weeks.

## 2018-12-18 DIAGNOSIS — M2550 Pain in unspecified joint: Secondary | ICD-10-CM | POA: Diagnosis not present

## 2018-12-18 DIAGNOSIS — Z79899 Other long term (current) drug therapy: Secondary | ICD-10-CM | POA: Diagnosis not present

## 2018-12-18 DIAGNOSIS — M0579 Rheumatoid arthritis with rheumatoid factor of multiple sites without organ or systems involvement: Secondary | ICD-10-CM | POA: Diagnosis not present

## 2018-12-18 DIAGNOSIS — M791 Myalgia, unspecified site: Secondary | ICD-10-CM | POA: Diagnosis not present

## 2019-01-06 DIAGNOSIS — M0579 Rheumatoid arthritis with rheumatoid factor of multiple sites without organ or systems involvement: Secondary | ICD-10-CM | POA: Diagnosis not present

## 2019-01-14 ENCOUNTER — Other Ambulatory Visit: Payer: Self-pay | Admitting: Medical

## 2019-01-14 NOTE — Telephone Encounter (Signed)
Is this ok to refill?  

## 2019-01-20 DIAGNOSIS — M0579 Rheumatoid arthritis with rheumatoid factor of multiple sites without organ or systems involvement: Secondary | ICD-10-CM | POA: Diagnosis not present

## 2019-02-03 DIAGNOSIS — Z79899 Other long term (current) drug therapy: Secondary | ICD-10-CM | POA: Diagnosis not present

## 2019-02-03 DIAGNOSIS — M0579 Rheumatoid arthritis with rheumatoid factor of multiple sites without organ or systems involvement: Secondary | ICD-10-CM | POA: Diagnosis not present

## 2019-02-05 ENCOUNTER — Other Ambulatory Visit: Payer: Self-pay | Admitting: Medical

## 2019-02-05 DIAGNOSIS — H04123 Dry eye syndrome of bilateral lacrimal glands: Secondary | ICD-10-CM | POA: Diagnosis not present

## 2019-02-05 DIAGNOSIS — I10 Essential (primary) hypertension: Secondary | ICD-10-CM

## 2019-03-03 DIAGNOSIS — M0579 Rheumatoid arthritis with rheumatoid factor of multiple sites without organ or systems involvement: Secondary | ICD-10-CM | POA: Diagnosis not present

## 2019-03-14 ENCOUNTER — Other Ambulatory Visit: Payer: Self-pay | Admitting: Medical

## 2019-03-16 NOTE — Telephone Encounter (Signed)
Is this okay to refill? 

## 2019-03-31 DIAGNOSIS — M0579 Rheumatoid arthritis with rheumatoid factor of multiple sites without organ or systems involvement: Secondary | ICD-10-CM | POA: Diagnosis not present

## 2019-04-18 ENCOUNTER — Other Ambulatory Visit: Payer: Self-pay | Admitting: Medical

## 2019-04-18 DIAGNOSIS — I10 Essential (primary) hypertension: Secondary | ICD-10-CM

## 2019-04-20 DIAGNOSIS — M2550 Pain in unspecified joint: Secondary | ICD-10-CM | POA: Diagnosis not present

## 2019-04-20 DIAGNOSIS — Z79899 Other long term (current) drug therapy: Secondary | ICD-10-CM | POA: Diagnosis not present

## 2019-04-20 DIAGNOSIS — M0579 Rheumatoid arthritis with rheumatoid factor of multiple sites without organ or systems involvement: Secondary | ICD-10-CM | POA: Diagnosis not present

## 2019-04-20 NOTE — Telephone Encounter (Signed)
Pt cancelled her cpe on June 1st because courts is suppose to be reopening then and she will have a lot of court cases. Is this okay to refill for a couple months.

## 2019-04-28 DIAGNOSIS — M0579 Rheumatoid arthritis with rheumatoid factor of multiple sites without organ or systems involvement: Secondary | ICD-10-CM | POA: Diagnosis not present

## 2019-04-28 DIAGNOSIS — Z79899 Other long term (current) drug therapy: Secondary | ICD-10-CM | POA: Diagnosis not present

## 2019-05-04 ENCOUNTER — Encounter: Payer: BLUE CROSS/BLUE SHIELD | Admitting: Medical

## 2019-05-26 DIAGNOSIS — M0579 Rheumatoid arthritis with rheumatoid factor of multiple sites without organ or systems involvement: Secondary | ICD-10-CM | POA: Diagnosis not present

## 2019-06-06 ENCOUNTER — Other Ambulatory Visit: Payer: Self-pay | Admitting: Medical

## 2019-06-08 ENCOUNTER — Other Ambulatory Visit: Payer: Self-pay | Admitting: Medical

## 2019-06-08 MED ORDER — VENLAFAXINE HCL ER 150 MG PO CP24: 150.0000 mg | ORAL_CAPSULE | Freq: Every day | ORAL | 0 refills | Status: DC

## 2019-06-08 MED ORDER — ALPRAZOLAM 0.5 MG PO TABS: 0.5000 mg | ORAL_TABLET | Freq: Two times a day (BID) | ORAL | 0 refills | Status: DC | PRN

## 2019-06-08 NOTE — Telephone Encounter (Signed)
Lets get on schedule for physical.   Last physical was 04/2018.   Last med check 34mo ago.

## 2019-06-09 NOTE — Telephone Encounter (Signed)
Pt already has an appt for cpe scheduled

## 2019-06-09 NOTE — Telephone Encounter (Signed)
Good, I sent #[phone] last night

## 2019-06-11 ENCOUNTER — Ambulatory Visit: Payer: BC Managed Care – PPO | Admitting: Medical

## 2019-06-11 ENCOUNTER — Encounter: Payer: Self-pay | Admitting: Medical

## 2019-06-11 ENCOUNTER — Other Ambulatory Visit: Payer: Self-pay

## 2019-06-11 VITALS — BP 130/80 | HR 85 | Temp 98.2°F | Resp 16 | Ht 63.0 in | Wt 188.6 lb

## 2019-06-11 DIAGNOSIS — I10 Essential (primary) hypertension: Secondary | ICD-10-CM

## 2019-06-11 DIAGNOSIS — Z79899 Other long term (current) drug therapy: Secondary | ICD-10-CM

## 2019-06-11 DIAGNOSIS — M069 Rheumatoid arthritis, unspecified: Secondary | ICD-10-CM

## 2019-06-11 DIAGNOSIS — F411 Generalized anxiety disorder: Secondary | ICD-10-CM

## 2019-06-11 DIAGNOSIS — E669 Obesity, unspecified: Secondary | ICD-10-CM

## 2019-06-11 DIAGNOSIS — K219 Gastro-esophageal reflux disease without esophagitis: Secondary | ICD-10-CM

## 2019-06-11 DIAGNOSIS — Z23 Encounter for immunization: Secondary | ICD-10-CM

## 2019-06-11 DIAGNOSIS — Z833 Family history of diabetes mellitus: Secondary | ICD-10-CM | POA: Diagnosis not present

## 2019-06-11 DIAGNOSIS — Z0000 Encounter for general adult medical examination without abnormal findings: Secondary | ICD-10-CM | POA: Diagnosis not present

## 2019-06-11 DIAGNOSIS — G4700 Insomnia, unspecified: Secondary | ICD-10-CM

## 2019-06-11 NOTE — Progress Notes (Signed)
Subjective:   HPI  Tamara Vega is a 33 y.o. female who presents for a complete physical.  Chief Complaint  Patient presents with  . CPE    fasting CPE   eye exam 12-19   has gyn in 10/20   Medical care team includes:   Sees Dr. Jess Barters, gynecology  Dr. Gavin Pound, rheumatology , Camelia Eng, PA-C here for primary care Sees dentist Sees eye doctor   Concerns: Here for annual physical/checkup.     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  So this has changed how they do things.  No physical issues currently.   Had RA flare up few weeks ago.  Usually related to eating a sugary item.     Has been more active, exercising, started Weight Watchers in December 2019.  Since December has lost 32lb per her scale.    Medications are stable, doing fine on current regimen for sleep, anxiety, blood pressure.  Reviewed their medical, surgical, family, social, medication, and allergy history and updated chart as appropriate.  Past Medical History:  Diagnosis Date  . Chronic headache   . Family history of diabetes mellitus   . Generalized anxiety disorder   . GERD (gastroesophageal reflux disease)   . High risk medication use    Humira, PPI  . Hypertension   . Insomnia   . Obesity   . Rheumatoid arthritis ([place]) 04/2015   started Humira 04/2016; Dr. Gavin Pound  . Seasonal allergies     Past Surgical History:  Procedure Laterality Date  . NO PAST SURGERIES     06/2019    Social History   Socioeconomic History  . Marital status: Single    Spouse name: Not on file  . Number of children: Not on file  . Years of education: Not on file  . Highest education level: Not on file  Occupational History  . Not on file  Social Needs  . Financial resource strain: Not on file  . Food insecurity    Worry: Not on file    Inability: Not on file  . Transportation needs    Medical: Not on file    Non-medical: Not on  file  Tobacco Use  . Smoking status: Never Smoker  . Smokeless tobacco: Never Used  Substance and Sexual Activity  . Alcohol use: Yes    Alcohol/week: 1.0 standard drinks    Types: 1 Glasses of wine per week  . Drug use: No  . Sexual activity: Not on file  Lifestyle  . Physical activity    Days per week: Not on file    Minutes per session: Not on file  . Stress: Not on file  Relationships  . Social Herbalist on phone: Not on file    Gets together: Not on file    Attends religious service: Not on file    Active member of club or organization: Not on file    Attends meetings of clubs or organizations: Not on file    Relationship status: Not on file  . Intimate partner violence    Fear of current or ex partner: Not on file    Emotionally abused: Not on file    Physically abused: Not on file    Forced sexual activity: Not on file  Other Topics Concern  . Not on file  Social History Narrative   Single, attorney, self employed 2019, was formerly  at [employer].   Drinks about 3L water daily.   Started Weight Watchers 11/2018.   [place] 12/2018, 4 times per week, doing facebook live zoom workouts, also doing long walks 6-10 mile walks.   15-20 minutes daily.  No significant other currently. 06/2019    Family History  Problem Relation Age of Onset  . Diabetes Father   . Hypertension Father   . High Cholesterol Father   . Diabetes Mother   . Hypertension Mother   . High Cholesterol Mother   . Heart disease Maternal Uncle   . Heart disease Maternal Grandmother   . Heart disease Maternal Grandfather   . Cancer Neg Hx      Current Outpatient Medications:  .  ALPRAZolam (XANAX) 0.5 MG tablet, Take 1 tablet (0.5 mg total) by mouth 2 (two) times daily as needed for anxiety. TAKE 1 TABLET BY MOUTH AS NEEDED FOR SLEEP, Disp: 60 tablet, Rfl: 0 .  APPLE CIDER VINEGAR PO, Take by mouth., Disp: , Rfl:  .  Ascorbic Acid (VITAMIN C) 100 MG tablet, Take 100 mg  daily by mouth., Disp: , Rfl:  .  atenolol (TENORMIN) 50 MG tablet, TAKE 1 TABLET BY MOUTH EVERY DAY, Disp: 90 tablet, Rfl: 0 .  Biotin 10000 MCG TBDP, Take 5,000 mg by mouth. , Disp: , Rfl:  .  cetirizine (ZYRTEC) 10 MG tablet, Take 10 mg by mouth daily., Disp: , Rfl:  .  Cranberry 1000 MG CAPS, Take by mouth., Disp: , Rfl:  .  folic acid (FOLVITE) 1 MG tablet, Take 1 mg by mouth 3 (three) times daily. , Disp: , Rfl: 3 .  hydrochlorothiazide (HYDRODIURIL) 25 MG tablet, TAKE 1 TABLET BY MOUTH EVERY DAY, Disp: 90 tablet, Rfl: 0 .  hydroxychloroquine (PLAQUENIL) 200 MG tablet, Take 2 tablets by mouth daily., Disp: , Rfl:  .  methotrexate (RHEUMATREX) 2.5 MG tablet, 4 TABLETS AS DIRECTED ONCE WEEKLY ORALLY 30 DAYS, Disp: , Rfl: 2 .  mometasone (NASONEX) 50 MCG/ACT nasal spray, Place 2 sprays into the nose daily., Disp: 17 g, Rfl: 0 .  Multiple Vitamins-Minerals (MULTIVITAMIN WITH MINERALS) tablet, Take 1 tablet by mouth daily., Disp: , Rfl:  .  norethindrone-ethinyl estradiol-iron (MICROGESTIN FE,GILDESS FE,LOESTRIN FE) 1.5-30 MG-MCG tablet, Take 1 tablet by mouth daily., Disp: , Rfl:  .  omeprazole (PRILOSEC OTC) 20 MG tablet, Take 20 mg by mouth daily., Disp: , Rfl:  .  traZODone (DESYREL) 50 MG tablet, TAKE 1/2-1 TABLETS BY MOUTH AT BEDTIME FOR SLEEP, Disp: 90 tablet, Rfl: 1 .  valACYclovir (VALTREX) 500 MG tablet, Take 500 mg by mouth 2 (two) times daily., Disp: , Rfl:  .  venlafaxine XR (EFFEXOR-XR) 150 MG 24 hr capsule, Take 1 capsule (150 mg total) by mouth daily with breakfast., Disp: 30 capsule, Rfl: 0 .  vitamin B-12 (CYANOCOBALAMIN) 100 MCG tablet, Take 100 mcg daily by mouth., Disp: , Rfl:  .  Golimumab ([place] ARIA IV), Inject into the vein., Disp: , Rfl:   Allergies  Allergen Reactions  . Bactrim [Sulfamethoxazole-Trimethoprim] Swelling    reddness and itching   . Diclofenac     Blood in stool once 2016    Review of Systems Constitutional: -fever, -chills, -sweats,  -unexpected weight change, -decreased appetite, -fatigue Allergy: -sneezing, -itching, -congestion Dermatology: -changing moles, --rash, -lumps ENT: -runny nose, -ear pain, -sore throat, -hoarseness, -sinus pain, -teeth pain, - ringing in ears, -hearing loss, -nosebleeds Cardiology: -chest pain, -palpitations, -swelling, -difficulty breathing when  lying flat, -waking up short of breath Respiratory: -cough, -shortness of breath, -difficulty breathing with exercise or exertion, -wheezing, -coughing up blood Gastroenterology: -abdominal pain, -nausea, -vomiting, -diarrhea, -constipation, -blood in stool, -changes in bowel movement, -difficulty swallowing or eating Hematology: -bleeding, -bruising  Musculoskeletal: -joint aches, -muscle aches, +joint swelling, -back pain, -neck pain, -cramping, -changes in gait Ophthalmology: denies vision changes, eye redness, itching, discharge Urology: -burning with urination, -difficulty urinating, -blood in urine, -urinary frequency, -urgency, -incontinence Neurology: -headache, -weakness, -tingling, -numbness, -memory loss, -falls, -dizziness Psychology: -depressed mood, -agitation, +sleep problems     Objective:   Physical Exam  BP 130/80   Pulse 85   Temp 98.2 F (36.8 C) (Temporal)   Resp 16   Ht 5\' 3"  (1.6 m)   Wt 188 lb 9.6 oz (85.5 kg)   SpO2 98%   BMI 33.41 kg/m   BP Readings from Last 3 Encounters:  06/11/19 130/80  11/27/18 130/84  05/01/18 122/88   Wt Readings from Last 3 Encounters:  06/11/19 188 lb 9.6 oz (85.5 kg)  11/27/18 211 lb (95.7 kg)  05/01/18 207 lb 9.6 oz (94.2 kg)    General appearance: alert, no distress, WD/WN, [place] female Skin: scattered macules, no worrisome lesions HEENT: normocephalic, conjunctiva/corneas normal, sclerae anicteric, PERRLA, EOMi, nares patent, no discharge or erythema, pharynx normal Oral cavity: MMM, tongue normal, teeth normal Neck: supple, no lymphadenopathy, no thyromegaly, no masses,  normal ROM, no bruits Chest: non tender, normal shape and expansion Heart: RRR, normal S1, S2, no murmurs Lungs: CTA bilaterally, no wheezes, rhonchi, or rales Abdomen: +bs, soft, non tender, non distended, no masses, no hepatomegaly, no splenomegaly, no bruits Back: non tender, normal ROM, no scoliosis Musculoskeletal: upper extremities non tender, no obvious deformity, normal ROM throughout, lower extremities non tender, no obvious deformity, normal ROM throughout Extremities: no edema, no cyanosis, no clubbing Pulses: 2+ symmetric, upper and lower extremities, normal cap refill Neurological: alert, oriented x 3, CN2-12 intact, strength normal upper extremities and lower extremities, sensation normal throughout, DTRs 2+ throughout, no cerebellar signs, gait normal Psychiatric: normal affect, behavior normal, pleasant  Breast/gyn/rectal - deferred to gyn    Assessment and Plan :    Encounter Diagnoses  Name Primary?  . Encounter for health maintenance examination in adult Yes  . Family history of diabetes mellitus   . Essential hypertension   . Gastroesophageal reflux disease without esophagitis   . Rheumatoid arthritis, involving unspecified site, unspecified rheumatoid factor presence ([place])   . Generalized anxiety disorder   . High risk medication use   . Insomnia, unspecified type   . Obesity with serious comorbidity, unspecified classification, unspecified obesity type   . Need for Td vaccine      Congratulated her on her weight loss and lifestyle changes  Counseled on the Td (tetanus, diptheria) vaccine.  Vaccine information sheet given. Td vaccine given after consent obtained.  If her platelets and WBC are still elevated on labs next week per rheumatology, she will discuss with rheumatology further.    I reviewed labs from 04/29/2019 she had on My Chart on her phone from rheumatology.  CBC showed mildly elevated platelets and WBC, and there were mild abnormalities of the  CMET.   She will ask rheumatology to forward labs next week to me.   We will request copy of last pap smear   Physical exam - discussed healthy lifestyle, diet, exercise, preventative care, vaccinations, and addressed their concerns.   See your eye doctor yearly for  routine vision care. See your dentist yearly for routine dental care including hygiene visits twice yearly. See your gynecologist yearly for routine gynecological care.  RA - f/u with rheumatology q54mo  C/t current medications for anxiety, insomnia   Zohal was seen today for cpe.  Diagnoses and all orders for this visit:  Encounter for health maintenance examination in adult -     Hemoglobin A1c -     Lipid panel  Family history of diabetes mellitus -     Hemoglobin A1c  Essential hypertension -     Hemoglobin A1c -     Lipid panel  Gastroesophageal reflux disease without esophagitis  Rheumatoid arthritis, involving unspecified site, unspecified rheumatoid factor presence (HCC)  Generalized anxiety disorder  High risk medication use  Insomnia, unspecified type  Obesity with serious comorbidity, unspecified classification, unspecified obesity type -     Hemoglobin A1c -     Lipid panel  Need for Td vaccine -     Td : Tetanus/diphtheria >7yo Preservative  free

## 2019-06-12 LAB — LIPID PANEL
Chol/HDL Ratio: 3 ratio (ref 0.0–4.4)
Cholesterol, Total: 178 mg/dL (ref 100–199)
HDL: 60 mg/dL (ref >39)
LDL Calculated: 94 mg/dL (ref 0–99)
Triglycerides: 122 mg/dL (ref 0–149)
VLDL Cholesterol Cal: 24 mg/dL (ref 5–40)

## 2019-06-12 LAB — HEMOGLOBIN A1C
Est. average glucose Bld gHb Est-mCnc: 117 mg/dL
Hgb A1c MFr Bld: 5.7 % — ABNORMAL HIGH (ref 4.8–5.6)

## 2019-06-16 ENCOUNTER — Encounter: Payer: Self-pay | Admitting: Medical

## 2019-06-16 ENCOUNTER — Ambulatory Visit (INDEPENDENT_AMBULATORY_CARE_PROVIDER_SITE_OTHER): Payer: BC Managed Care – PPO | Admitting: Medical

## 2019-06-16 ENCOUNTER — Other Ambulatory Visit: Payer: Self-pay

## 2019-06-16 VITALS — BP 126/78 | HR 83 | Temp 99.0°F | Ht 63.0 in | Wt 186.0 lb

## 2019-06-16 DIAGNOSIS — Z79899 Other long term (current) drug therapy: Secondary | ICD-10-CM

## 2019-06-16 DIAGNOSIS — T50Z95A Adverse effect of other vaccines and biological substances, initial encounter: Secondary | ICD-10-CM | POA: Diagnosis not present

## 2019-06-16 MED ORDER — HYDROCODONE-ACETAMINOPHEN 5-325 MG PO TABS: 1.0000 | ORAL_TABLET | Freq: Four times a day (QID) | ORAL | 0 refills | Status: DC | PRN

## 2019-06-16 NOTE — Progress Notes (Signed)
Subjective: Chief Complaint  Patient presents with  . Allergic Reaction    tetanus shot itching,warm to touch and swollen    Here for follow up from Td vaccine here on 06/11/2019.  She notes after that visit, she had pain, swelling localized, itching, pink coloration and warmth within hours.   The pain and symptoms including headache and nausea persisted til now.   She has had improvement in that the area of hardness/swelling has improved, but still has mild warmth, pain, headache, nausea.  She is using tylenol a few times daily, ice pack, and rest.  She has felt quite fatigued, exhausted since the vaccine.   She normally sleeps on the right, but can't do this now.  No other aggravating or relieving factors. No other complaint.   Objective: BP 126/78   Pulse 83   Temp 99 F (37.2 C) (Oral)   Ht 5\' 3"  (1.6 m)   Wt 186 lb (84.4 kg)   SpO2 98%   BMI 32.95 kg/m   Gen: wd, wn, nad Right deltoid region with 3cm  Round area of induration, mild warmth but no erythema, no fluctuance, tender in general in area of the recent vaccination, mild pain noted with right arm abduction at 90 degrees.  Otherwise arm non tender and no other deformity   Assessment: Encounter Diagnoses  Name Primary?  . Adverse effect of vaccine, initial encounter Yes  . High risk medication use      Plan: We discussed her concerns and findings.     She is on methotrexate and plaquenil and given her high risk medication use, this could have been somewhat of a factor regarding her reaction.  No obvious infection today.  The area of induration seems to be improving based on her history compared to last week. I advised she try heat therapy/warm towel 20 minutes on 20 minutes off the next few days instead of ice, continue tylenol for pain or for worse pain can use the pain medication prescribed today but caution with sedation.   Can use some benadryl at bedtime the next few nights.  Use some gentle arm stretching and ROM  activity.   Watch for signs of infection as discussed.   If any worse symptom/signs, then return immediately.   Will report reaction to vaccine reporting system.   Tamara Vega was seen today for allergic reaction.  Diagnoses and all orders for this visit:  Adverse effect of vaccine, initial encounter  High risk medication use  Other orders -     HYDROcodone-acetaminophen (NORCO) 5-325 MG tablet; Take 1 tablet by mouth every 6 (six) hours as needed for moderate pain.

## 2019-06-23 DIAGNOSIS — M0579 Rheumatoid arthritis with rheumatoid factor of multiple sites without organ or systems involvement: Secondary | ICD-10-CM | POA: Diagnosis not present

## 2019-06-24 ENCOUNTER — Telehealth: Payer: Self-pay | Admitting: Medical

## 2019-06-24 NOTE — Telephone Encounter (Signed)
VERS -

## 2019-07-04 ENCOUNTER — Other Ambulatory Visit: Payer: Self-pay | Admitting: Medical

## 2019-07-08 ENCOUNTER — Other Ambulatory Visit: Payer: Self-pay | Admitting: Medical

## 2019-07-08 ENCOUNTER — Telehealth: Payer: Self-pay | Admitting: Medical

## 2019-07-08 DIAGNOSIS — I10 Essential (primary) hypertension: Secondary | ICD-10-CM

## 2019-07-08 NOTE — Telephone Encounter (Signed)
Can you help me file a vaccine reaction with VERS online.  See her last visit

## 2019-07-08 NOTE — Telephone Encounter (Signed)
Received requested records from [place].

## 2019-07-09 NOTE — Telephone Encounter (Signed)
Is this ok to refill?  

## 2019-07-16 NOTE — Telephone Encounter (Signed)
I do not know how to do this.   Gabriel Cirri usually takes care of things like this.

## 2019-07-21 DIAGNOSIS — M0579 Rheumatoid arthritis with rheumatoid factor of multiple sites without organ or systems involvement: Secondary | ICD-10-CM | POA: Diagnosis not present

## 2019-07-21 NOTE — Telephone Encounter (Signed)
I submitted Vaccine Adverse Event on the VERS website.

## 2019-08-18 DIAGNOSIS — M0579 Rheumatoid arthritis with rheumatoid factor of multiple sites without organ or systems involvement: Secondary | ICD-10-CM | POA: Diagnosis not present

## 2019-08-26 ENCOUNTER — Telehealth: Payer: Self-pay | Admitting: Medical

## 2019-08-26 NOTE — Telephone Encounter (Signed)
Lets get her in or do virtual.  She sent me email about some bumps on the arm that sound unusual.

## 2019-08-26 NOTE — Telephone Encounter (Signed)
Pt Is going virtual 09/24

## 2019-08-27 ENCOUNTER — Ambulatory Visit: Payer: BC Managed Care – PPO | Admitting: Medical

## 2019-08-27 ENCOUNTER — Encounter: Payer: Self-pay | Admitting: Medical

## 2019-08-27 ENCOUNTER — Other Ambulatory Visit: Payer: Self-pay

## 2019-08-27 VITALS — Ht 62.75 in | Wt 177.1 lb

## 2019-08-27 DIAGNOSIS — Z79899 Other long term (current) drug therapy: Secondary | ICD-10-CM

## 2019-08-27 DIAGNOSIS — R21 Rash and other nonspecific skin eruption: Secondary | ICD-10-CM | POA: Diagnosis not present

## 2019-08-27 MED ORDER — PREDNISONE 10 MG PO TABS: ORAL_TABLET | ORAL | 0 refills | Status: DC

## 2019-08-27 MED ORDER — TRIAMCINOLONE ACETONIDE 0.1 % EX CREA: 1.0000 "application " | TOPICAL_CREAM | Freq: Two times a day (BID) | CUTANEOUS | 0 refills | Status: DC

## 2019-08-27 NOTE — Progress Notes (Addendum)
Subjective:     Patient ID: Tamara Vega, female   DOB: 1986-08-21, 33 y.o.   MRN: [id number]  This visit type was conducted due to national recommendations for restrictions regarding the COVID-19 Pandemic (e.g. social distancing) in an effort to limit this patient's exposure and mitigate transmission in our community.  Due to their co-morbid illnesses, this patient is at least at moderate risk for complications without adequate follow up.  This format is felt to be most appropriate for this patient at this time.    Documentation for virtual audio and video telecommunications through Zoom encounter:  The patient was located at home. The provider was located in the office. The patient did consent to this visit and is aware of possible charges through their insurance for this visit.  The other persons participating in this telemedicine service were none. Time spent on call was 15 minutes and in review of previous records >15 minutes total.  This virtual service is not related to other E/M service within previous 7 days.   HPI Chief Complaint  Patient presents with  . Rash    bumps on arm-bilateral about 1 week    Consult today for rash/bumps on arms for about a week.  Started as scattered bumps on both arms that are raised some them red, some known look like pustules that are painful to touch.  They only started becoming itchy yesterday.  She does use Palmira coconut oil and Shea butter lotion regularly.  She denies any new other exposures.  No recent sick contacts with similar.  No recent travel or hotel stay.  No other contacts with same bumps.  No prior similar.  She did get her last Orencia infusion about a week ago but has not had this particular reaction before she also continues on Plaquenil and methotrexate.  She does note sometime within the past year having a skin rash being out in the sun in [place] shortly after infusion.   But that was more splotchy rash not  like the current rash.  No fever.  No other symptoms.No other aggravating or relieving factors. No other complaint.   Review of Systems As in subjective    Objective:   Physical Exam Due to coronavirus pandemic stay at home measures, patient visit was virtual and they were not examined in person.   General: Well-developed well-nourished no acute stress She has numerous scattered papular lesions approximately 2 to 3 mm diameter flesh-colored of bilateral arms but no other obvious rash, no obvious erythema, no obvious pustules      Assessment:     Encounter Diagnoses  Name Primary?  . Skin rash Yes  . High risk medication use        Plan:     We discussed possible causes which could include skin sensitivity, drug reaction, insect bite, allergy.  She denies any recent bug bites, has really been real careful with social distancing in contact with other people, uses a daily skin moisturizing lotion, and no other new exposure.  Begin triamcinolone cream topically to the arms, begin Benadryl 1 or 2 times daily, and we will give this a few days to see if we have improvement.  She was advised to call back if any signs of infection such as worse redness, warmth, fever.  Either way I asked her to let me know within 5 to 7 days whether there is been improvement or not.   Tamara Vega was seen today for rash.  Diagnoses and  all orders for this visit:  Skin rash  High risk medication use  Other orders -     triamcinolone cream (KENALOG) 0.1 %; Apply 1 application topically 2 (two) times daily. -     predniSONE (DELTASONE) 10 MG tablet; 6/5/4/3/2/1 taper

## 2019-08-27 NOTE — Addendum Note (Signed)
Addended by: Carlena Hurl on: 08/27/2019 11:57 AM   Modules accepted: Orders

## 2019-09-08 ENCOUNTER — Other Ambulatory Visit: Payer: Self-pay

## 2019-09-08 ENCOUNTER — Telehealth: Payer: Self-pay | Admitting: Obstetrics & Gynecology

## 2019-09-08 NOTE — Telephone Encounter (Signed)
Message left to return call to Triage Nurse at [phone].    

## 2019-09-08 NOTE — Telephone Encounter (Signed)
CPS+, patient states over the weekend she had congestion with a sore throat that lasted about two hours each day on Friday, Saturday, and Sunday. States she is OK with no symptoms now.

## 2019-09-08 NOTE — Telephone Encounter (Signed)
Call to patient. Patient states that she had nasal congestion and sore throat only in the morning Friday, Saturday and Sunday. States she knows it is related to the weather changes. Denies covid symptoms yesterday or today. No recent exposure to anyone with suspected covid and no international travel. Reviewed with Marketing executive. Patient advised to keep appointment as scheduled for Thursday and return call to office prior to appointment if symptoms return. Patient verbalized understanding and agreeable.   Routing to provider and will close encounter.

## 2019-09-10 ENCOUNTER — Other Ambulatory Visit (HOSPITAL_COMMUNITY)
Admission: RE | Admit: 2019-09-10 | Discharge: 2019-09-10 | Disposition: A | Discharge status: Home / Self Care | Payer: BC Managed Care – PPO | Source: Ambulatory Visit | Attending: Obstetrics & Gynecology | Admitting: Obstetrics & Gynecology

## 2019-09-10 ENCOUNTER — Encounter: Payer: Self-pay | Admitting: Obstetrics & Gynecology

## 2019-09-10 ENCOUNTER — Ambulatory Visit: Payer: BC Managed Care – PPO | Admitting: Obstetrics & Gynecology

## 2019-09-10 ENCOUNTER — Other Ambulatory Visit: Payer: Self-pay

## 2019-09-10 ENCOUNTER — Telehealth: Payer: Self-pay | Admitting: Obstetrics & Gynecology

## 2019-09-10 VITALS — BP 120/86 | HR 88 | Temp 97.3°F | Ht 62.25 in | Wt 180.0 lb

## 2019-09-10 DIAGNOSIS — Z113 Encounter for screening for infections with a predominantly sexual mode of transmission: Secondary | ICD-10-CM | POA: Diagnosis not present

## 2019-09-10 DIAGNOSIS — R894 Abnormal immunological findings in specimens from other organs, systems and tissues: Secondary | ICD-10-CM

## 2019-09-10 DIAGNOSIS — Z124 Encounter for screening for malignant neoplasm of cervix: Secondary | ICD-10-CM | POA: Insufficient documentation

## 2019-09-10 DIAGNOSIS — Z01419 Encounter for gynecological examination (general) (routine) without abnormal findings: Secondary | ICD-10-CM | POA: Diagnosis not present

## 2019-09-10 DIAGNOSIS — I10 Essential (primary) hypertension: Secondary | ICD-10-CM

## 2019-09-10 DIAGNOSIS — B0082 Herpes simplex myelitis: Secondary | ICD-10-CM | POA: Diagnosis not present

## 2019-09-10 DIAGNOSIS — Z3009 Encounter for other general counseling and advice on contraception: Secondary | ICD-10-CM

## 2019-09-10 DIAGNOSIS — D849 Immunodeficiency, unspecified: Secondary | ICD-10-CM | POA: Insufficient documentation

## 2019-09-10 NOTE — Progress Notes (Signed)
33 y.o. G0P0000 Single Asian female here for new patient annual exam.  Lives in [place], [place].  Previous gyn retired last year.    Cycles are regular.  Cycles last 2-3 days.  Flow is very light.  Does have hypertension and reviewed risks with combination OCPs and hypertension.  She is aware I think we need to change methods.  She has been considering IUD use.  POPs, depo provera, Nexplanon and progesterone IUDs discussed.    Has RA and is followed by Dr. Trudie Reed.    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  Ended relationship she was in with person "who wasn't very nice".  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  Has been on Valtrex since.  Has never had a symptom.  Did not have any follow up testing.  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    Has lost about 40 pounds since last year.  Working on it with burn bootcamp and better diet with [employer].  Would like to continue to work on this.  Patient's last menstrual period was 07/20/2019 (exact date).          Sexually active: No The current method of family planning is abstinence and OCP (estrogen/progesterone).    Exercising: Yes.    bootcamp, power walks  Smoker:  no  Health Maintenance: Pap:  04/2015 Neg. HR HPV:neg  History of abnormal Pap:  no TDaP:  2020 Flu Vacc: done Hep C testing: 09/02/18 Neg  Screening Labs: STD testing    reports that she has never smoked. She has never used smokeless tobacco. She reports current alcohol use of about 3.0 standard drinks of alcohol per week. She reports that she does not use drugs.  Past Medical History:  Diagnosis Date  . Chronic headache   . Clotting disorder ([place])   . Dysmenorrhea   . Family history of diabetes mellitus   . Generalized anxiety disorder   . GERD (gastroesophageal reflux disease)   . High risk medication use    Humira, PPI  . HSV-1 (herpes simplex virus 1) infection   . Hypertension   . Insomnia   . Obesity    . Rheumatoid arthritis ([place]) 10/2014   started Humira 04/2016; Dr. Gavin Pound  . Seasonal allergies     History reviewed. No pertinent surgical history.  Current Outpatient Medications  Medication Sig Dispense Refill  . abatacept (ORENCIA) 250 MG injection Inject 250 mg into the vein. Every 4 weeks    . ALPRAZolam (XANAX) 0.5 MG tablet Take 1 tablet (0.5 mg total) by mouth 2 (two) times daily as needed for anxiety. TAKE 1 TABLET BY MOUTH AS NEEDED FOR SLEEP 60 tablet 0  . APPLE CIDER VINEGAR PO Take by mouth.    . Ascorbic Acid (VITAMIN C) 100 MG tablet Take 100 mg daily by mouth.    Marland Kitchen atenolol (TENORMIN) 50 MG tablet TAKE 1 TABLET BY MOUTH EVERY DAY 90 tablet 0  . Biotin 10000 MCG TBDP Take 5,000 mg by mouth.     . cetirizine (ZYRTEC) 10 MG tablet Take 10 mg by mouth daily.    . Cranberry 1000 MG CAPS Take by mouth.    . folic acid (FOLVITE) 1 MG tablet Take 1 mg by mouth 3 (three) times daily.   3  . hydrochlorothiazide (HYDRODIURIL) 25 MG tablet TAKE 1 TABLET BY MOUTH EVERY DAY 90 tablet 0  . hydroxychloroquine (PLAQUENIL)  200 MG tablet Take 2 tablets by mouth daily.    . methotrexate (RHEUMATREX) 2.5 MG tablet 4 TABLETS AS DIRECTED ONCE WEEKLY ORALLY 30 DAYS  2  . Multiple Vitamins-Minerals (MULTIVITAMIN WITH MINERALS) tablet Take 1 tablet by mouth daily.    . norethindrone-ethinyl estradiol (LOESTRIN FE) 1-20 MG-MCG tablet Take 1 tablet by mouth daily.    Marland Kitchen omeprazole (PRILOSEC OTC) 20 MG tablet Take 20 mg by mouth daily.    . RESTASIS 0.05 % ophthalmic emulsion INSTILL 1 DROP INTO BOTH EYES TWICE A DAY    . traZODone (DESYREL) 50 MG tablet TAKE 1/2-1 TABLETS BY MOUTH AT BEDTIME FOR SLEEP 90 tablet 1  . valACYclovir (VALTREX) 500 MG tablet Take 500 mg by mouth 2 (two) times daily.    Marland Kitchen venlafaxine XR (EFFEXOR-XR) 150 MG 24 hr capsule TAKE 1 CAPSULE (150 MG TOTAL) BY MOUTH DAILY WITH BREAKFAST. 90 capsule 1  . vitamin B-12 (CYANOCOBALAMIN) 100 MCG tablet Take 100 mcg daily by  mouth.     No current facility-administered medications for this visit.     Family History  Problem Relation Age of Onset  . Diabetes Father   . Hypertension Father   . High Cholesterol Father   . Diabetes Mother   . Hypertension Mother   . High Cholesterol Mother   . Heart disease Maternal Uncle   . Heart disease Maternal Grandmother   . Heart disease Maternal Grandfather   . Cancer Neg Hx     Review of Systems  Genitourinary:       Amenorrhea  Irregular periods   All other systems reviewed and are negative.   Exam:   BP 120/86   Pulse 88   Temp (!) 97.3 F (36.3 C) (Temporal)   Ht 5' 2.25" (1.581 m)   Wt 180 lb (81.6 kg)   LMP 07/20/2019 (Exact Date)   BMI 32.66 kg/m   Height: 5' 2.25" (158.1 cm)  Ht Readings from Last 3 Encounters:  09/10/19 5' 2.25" (1.581 m)  08/27/19 5' 2.75" (1.594 m)  06/16/19 5\' 3"  (1.6 m)    General appearance: alert, cooperative and appears stated age Head: Normocephalic, without obvious abnormality, atraumatic Neck: no adenopathy, supple, symmetrical, trachea midline and thyroid normal to inspection and palpation Lungs: clear to auscultation bilaterally Breasts: normal appearance, no masses or tenderness Heart: regular rate and rhythm Abdomen: soft, non-tender; bowel sounds normal; no masses,  no organomegaly Extremities: extremities normal, atraumatic, no cyanosis or edema Skin: Skin color, texture, turgor normal. No rashes or lesions Lymph nodes: Cervical, supraclavicular, and axillary nodes normal. No abnormal inguinal nodes palpated Neurologic: Grossly normal   Pelvic: External genitalia:  no lesions              Urethra:  normal appearing urethra with no masses, tenderness or lesions              Bartholins and Skenes: normal                 Vagina: normal appearing vagina with normal color and discharge, no lesions              Cervix: no lesions              Pap taken: Yes.   Bimanual Exam:  Uterus:  normal size,  contour, position, consistency, mobility, non-tender              Adnexa: normal adnexa and no mass, fullness, tenderness  Rectovaginal: Confirms               Anus:  normal sphincter tone, no lesions  Chaperone was present for exam.  A:  Well Woman with normal exam H/o RA, followed by Dr. Trudie Reed H/o HSV 1/2 IgM antibodies without any further testing Hypertension On combination OCPs but advised to stop due to risks with hypertension Prediabetes with HbA1C of 5.7 (06/11/2019)   P:   Mammogram guidelines reviewed.  Aware to start around age 56. pap smear and HR HPV obtained today STD testing done today including GC/CHl/trich, HIV, RPR, Hep C (has completed Hep B vaccine series) Does have interest in starting Gardisil HSV 1/2 IgG obtained today.   Will finish back of OCPs and return next week when on cycle for IUD placement.  Options, risks and benefits all reviewed.  She is likely going to use a [place] IUD.   return annually or prn  In additional to new gyn exam, additional 15 minutes spent discussing hypertension and OCP use, options for contraception/cycle control, HSV testing, valtrex use

## 2019-09-10 NOTE — Telephone Encounter (Signed)
Call placed to patient to review benefit for scheduled IUD insertion. Patient acknowledges understanding of information presented. Patient is scheduled 09/17/2019 with Dr Sabra Heck. Patient is aware of the appointment date, arrival time and cancellation policy. No further questions.  Forwarding to Dr Sabra Heck for final review. Patient is agreeable to disposition. Will close encounter

## 2019-09-11 ENCOUNTER — Encounter: Payer: Self-pay | Admitting: Obstetrics & Gynecology

## 2019-09-14 LAB — HSV 1 AND 2 IGM ABS, INDIRECT
HSV 1 IgM: <1:10 {titer}
HSV 2 IgM: <1:10 {titer}

## 2019-09-14 LAB — HEPATITIS C ANTIBODY: Hep C Virus Ab: <0.1 s/co ratio (ref 0.0–0.9)

## 2019-09-14 LAB — HIV ANTIBODY (ROUTINE TESTING W REFLEX): HIV Screen 4th Generation wRfx: NONREACTIVE

## 2019-09-14 LAB — RPR: RPR Ser Ql: NONREACTIVE

## 2019-09-15 ENCOUNTER — Other Ambulatory Visit: Payer: Self-pay

## 2019-09-15 ENCOUNTER — Telehealth: Payer: Self-pay | Admitting: *Deleted

## 2019-09-15 DIAGNOSIS — M0579 Rheumatoid arthritis with rheumatoid factor of multiple sites without organ or systems involvement: Secondary | ICD-10-CM | POA: Diagnosis not present

## 2019-09-15 NOTE — Telephone Encounter (Signed)
Pt notified.  Verbalized understanding.

## 2019-09-15 NOTE — Telephone Encounter (Signed)
Patient returning call.

## 2019-09-15 NOTE — Telephone Encounter (Signed)
-----   Message from Megan Salon, MD sent at 09/14/2019 10:29 AM EDT ----- Please let pt know her HSV 1 and 2 IgG were negative.  This does now show prior infection.  She is coming for IUD placement later this week.  We will review when she comes for this appt.

## 2019-09-15 NOTE — Telephone Encounter (Signed)
LM for pt to call back.

## 2019-09-17 ENCOUNTER — Encounter: Payer: Self-pay | Admitting: Obstetrics & Gynecology

## 2019-09-17 ENCOUNTER — Ambulatory Visit (INDEPENDENT_AMBULATORY_CARE_PROVIDER_SITE_OTHER): Payer: BC Managed Care – PPO | Admitting: Obstetrics & Gynecology

## 2019-09-17 ENCOUNTER — Other Ambulatory Visit: Payer: Self-pay

## 2019-09-17 ENCOUNTER — Telehealth: Payer: Self-pay | Admitting: *Deleted

## 2019-09-17 VITALS — BP 128/86 | HR 84 | Temp 97.5°F | Ht 62.25 in | Wt 181.4 lb

## 2019-09-17 DIAGNOSIS — Z23 Encounter for immunization: Secondary | ICD-10-CM

## 2019-09-17 DIAGNOSIS — Z01812 Encounter for preprocedural laboratory examination: Secondary | ICD-10-CM

## 2019-09-17 DIAGNOSIS — Z30430 Encounter for insertion of intrauterine contraceptive device: Secondary | ICD-10-CM

## 2019-09-17 LAB — POCT URINE PREGNANCY: Preg Test, Ur: NEGATIVE

## 2019-09-17 NOTE — Addendum Note (Signed)
Addended by: Polly Cobia on: 09/17/2019 12:43 PM   Modules accepted: Orders

## 2019-09-17 NOTE — Telephone Encounter (Signed)
Called patient's Rheumatology provider Gavin Pound, MD. Spoke with Jenny Reichmann, [place]   Dr. Trudie Reed approved for patient to receive Gardasil injections. Dr. Sabra Heck aware.  Encounter closed.

## 2019-09-17 NOTE — Patient Instructions (Signed)
IUD PLACEMENT POST PROCEDURE INSTRUCTIONS  1. You may take Ibuprofen, Aleve or Tylenol for pain as needed.      Cramping should resolve within 24 hours.  2. You may have a small amount of spotting. You should wear a mini pad for the next few days   3. You need to call if you have any pelvic pain, fever, heavy bleeding or foul smelling vaginal discharge. Irregular bleeding is common the first several months after having an IUD placed. You do not need to for this reason unless you are                     concerned.   4. Shower or bathe as normal  5. You should have a follow-up appointment in 4-8 weeks for a re-check to make sure you are not having any problems.

## 2019-09-17 NOTE — Progress Notes (Signed)
33 y.o. G0P0000 Single Asian female presents for insertion of progesterone IUD.  She is on her placebo week this week.  Does have negative UPT.  Due to hypertension, have recommended she switch from estrogen containing methods.  She has been counseled about other forms of contraception including progesterone options, Paraguard IUD and condoms.  She feels IUD is the better option for her.  Pt has also been counseled about risks and benefits as well as complications.  Consent is obtained today.  All questions answered prior to start of procedure.    Current contraception: OCP Last STD testing:  09/10/19 LMP:  Patient's last menstrual period was 07/20/2019 (exact date).  Patient Active Problem List   Diagnosis Date Noted  . Immunization reaction 06/16/2019  . Generalized headaches 10/21/2017  . Snoring 10/21/2017  . Tachycardia 10/21/2017  . Essential hypertension 10/21/2017  . Family history of sleep apnea 12/06/2016  . Obesity 06/08/2016  . Family history of diabetes mellitus 06/07/2016  . High risk medication use 06/07/2016  . Gastroesophageal reflux disease without esophagitis 06/07/2016  . Encounter for health maintenance examination in adult 06/07/2016  . Rheumatoid arthritis ([place]) 06/07/2016  . Work-related stress 11/25/2015  . Generalized anxiety disorder 06/15/2015  . Insomnia 06/15/2015   Past Medical History:  Diagnosis Date  . Chronic headache   . Clotting disorder ([place])   . Dysmenorrhea   . Family history of diabetes mellitus   . Generalized anxiety disorder   . GERD (gastroesophageal reflux disease)   . High risk medication use    Humira, PPI  . Hypertension   . Insomnia   . Obesity   . Rheumatoid arthritis ([place]) 10/2014   started Humira 04/2016; Dr. Gavin Pound  . Seasonal allergies    Current Outpatient Medications on File Prior to Visit  Medication Sig Dispense Refill  . abatacept (ORENCIA) 250 MG injection Inject 250 mg into the vein. Every 4 weeks    .  ALPRAZolam (XANAX) 0.5 MG tablet Take 1 tablet (0.5 mg total) by mouth 2 (two) times daily as needed for anxiety. TAKE 1 TABLET BY MOUTH AS NEEDED FOR SLEEP 60 tablet 0  . APPLE CIDER VINEGAR PO Take by mouth.    . Ascorbic Acid (VITAMIN C) 100 MG tablet Take 100 mg daily by mouth.    Marland Kitchen atenolol (TENORMIN) 50 MG tablet TAKE 1 TABLET BY MOUTH EVERY DAY 90 tablet 0  . Biotin 10000 MCG TBDP Take 5,000 mg by mouth.     . cetirizine (ZYRTEC) 10 MG tablet Take 10 mg by mouth daily.    . Cranberry 1000 MG CAPS Take by mouth.    . folic acid (FOLVITE) 1 MG tablet Take 1 mg by mouth 3 (three) times daily.   3  . hydrochlorothiazide (HYDRODIURIL) 25 MG tablet TAKE 1 TABLET BY MOUTH EVERY DAY 90 tablet 0  . hydroxychloroquine (PLAQUENIL) 200 MG tablet Take 2 tablets by mouth daily.    . methotrexate (RHEUMATREX) 2.5 MG tablet 4 TABLETS AS DIRECTED ONCE WEEKLY ORALLY 30 DAYS  2  . Multiple Vitamins-Minerals (MULTIVITAMIN WITH MINERALS) tablet Take 1 tablet by mouth daily.    Marland Kitchen omeprazole (PRILOSEC OTC) 20 MG tablet Take 20 mg by mouth daily.    . RESTASIS 0.05 % ophthalmic emulsion INSTILL 1 DROP INTO BOTH EYES TWICE A DAY    . traZODone (DESYREL) 50 MG tablet TAKE 1/2-1 TABLETS BY MOUTH AT BEDTIME FOR SLEEP 90 tablet 1  . venlafaxine XR (EFFEXOR-XR) 150 MG 24  hr capsule TAKE 1 CAPSULE (150 MG TOTAL) BY MOUTH DAILY WITH BREAKFAST. 90 capsule 1  . vitamin B-12 (CYANOCOBALAMIN) 100 MCG tablet Take 100 mcg daily by mouth.    . valACYclovir (VALTREX) 500 MG tablet Take 500 mg by mouth 2 (two) times daily.     No current facility-administered medications on file prior to visit.    Bactrim [sulfamethoxazole-trimethoprim], Diclofenac, and Tdvax [tetanus-diphtheria toxoids td]  Review of Systems  All other systems reviewed and are negative.  Vitals:   09/17/19 1137  BP: 128/86  Pulse: 84  Temp: (!) 97.5 F (36.4 C)  TempSrc: Temporal  Weight: 181 lb 6.4 oz (82.3 kg)  Height: 5' 2.25" (1.581 m)     Gen:  WNWF healthy female NAD Abdomen: soft, non-tender Groin:  no inguinal nodes palpated  Pelvic exam: Vulva:  normal female genitalia Vagina:  normal vagina Cervix:  Non-tender, Negative CMT, no lesions or redness. Uterus:  normal shape, position and consistency   Procedure:  Speculum reinserted.  Cervix visualized and cleansed with Betadine x 3.  Paracervical block was not placed.  Single toothed tenaculum applied to anterior lip of cervix without difficulty.  1% Lidocaine without epinephrine was used.  Uterus sounded to 6.5cm.  Lot number: [id number].  Expiration:  01/2021.  IUD package was opened.  IUD and introducer passed to fundus and then withdrawn slightly before IUD was passed into endometrial cavity.  Introducer removed.  Strings cut to 2cm.  Tenaculum removed from cervix.  Minimal bleeding noted.  Pt tolerated the procedure well.  All instruments removed from vagina.  A: Insertion of [place] IUD Contraception desires Hypertension RA Gardisil vaccination series will be started today  P:  Return for recheck 6-8 weeks Pt aware to call for any concerns Pt aware removal due no later than 09/16/2024.  IUD card given to pt. Return in 2 and 6 months to complete Gardisil series

## 2019-09-21 ENCOUNTER — Telehealth: Payer: Self-pay | Admitting: *Deleted

## 2019-09-21 DIAGNOSIS — E049 Nontoxic goiter, unspecified: Secondary | ICD-10-CM

## 2019-09-21 LAB — CYTOLOGY - PAP
Chlamydia: NEGATIVE
Comment: NEGATIVE
Comment: NEGATIVE
Comment: NORMAL
Diagnosis: NEGATIVE
High risk HPV: POSITIVE — AB
Neisseria Gonorrhea: NEGATIVE

## 2019-09-21 NOTE — Telephone Encounter (Signed)
Patient returning call.

## 2019-09-21 NOTE — Telephone Encounter (Signed)
Left voicemail to call back.  Please route to triage if I am not available.  

## 2019-09-21 NOTE — Telephone Encounter (Signed)
-----   Message from Megan Salon, MD sent at 09/21/2019 10:14 AM EDT ----- Please let pt know her pap was normal but the HR HPV testing was positive.  According to the most recent guidelines, her risk of high grade cervical dysplasia is 2.25% so she needs repeat HR HPV testing in 1 year.  She needs a 12 month pap/HR HPV recall.  GC/Chl were negative.    Please see how she is doing since her IUD placement.  Thanks.

## 2019-09-21 NOTE — Telephone Encounter (Signed)
Spoke with patient. Advised as seen below per Dr. Sabra Heck. Recall previously placed.   1. Reports doing well after IUD placement. No bleeding. Some mild cramping that has been improving with Tylenol prn and heating pad prn.   2. Patient states she was advised at [facility] that the right side of her neck "felt different",  waiting for TSH lab results. Advised per review of 09/10/19 labs, no TSH labs drawn. Patient does report "sinus congestion" over the weekend, felt "stuffy" this morning. Denies any other symptoms.    Dr. Sabra Heck -please advise.

## 2019-09-22 NOTE — Telephone Encounter (Signed)
She has an enlarged thyroid.  Needs thyroid ultrasound.  Can you schedule for her?  Thanks.

## 2019-09-22 NOTE — Telephone Encounter (Signed)
Order placed for thyroid US at [facility].   Patient placed in IMG hold.   Spoke with Danae Chen at [facility] [employer]. Scheduled for Thyroid US on 09/29/19 arrive at 3:25pm, appt at 3:45pm. [address]    Patient notified and is agreeable to date and time.   Routing to provider for final review. Patient is agreeable to disposition. Will close encounter.  Cc: Lerry Liner, Magdalene Patricia

## 2019-09-23 LAB — SPECIMEN STATUS REPORT

## 2019-09-23 LAB — HSV(HERPES SIMPLEX VRS) I + II AB-IGG
HSV 1 Glycoprotein G Ab, IgG: <0.91 index (ref 0.00–0.90)
HSV 2 IgG, Type Spec: <0.91 index (ref 0.00–0.90)

## 2019-09-29 ENCOUNTER — Ambulatory Visit
Admission: RE | Admit: 2019-09-29 | Discharge: 2019-09-29 | Disposition: A | Discharge status: Home / Self Care | Payer: BC Managed Care – PPO | Source: Ambulatory Visit | Attending: Obstetrics & Gynecology | Admitting: Obstetrics & Gynecology

## 2019-09-29 DIAGNOSIS — E049 Nontoxic goiter, unspecified: Secondary | ICD-10-CM

## 2019-09-29 DIAGNOSIS — E042 Nontoxic multinodular goiter: Secondary | ICD-10-CM | POA: Diagnosis not present

## 2019-10-01 ENCOUNTER — Other Ambulatory Visit: Payer: Self-pay | Admitting: *Deleted

## 2019-10-01 DIAGNOSIS — E049 Nontoxic goiter, unspecified: Secondary | ICD-10-CM

## 2019-10-13 DIAGNOSIS — M0579 Rheumatoid arthritis with rheumatoid factor of multiple sites without organ or systems involvement: Secondary | ICD-10-CM | POA: Diagnosis not present

## 2019-10-15 DIAGNOSIS — R7989 Other specified abnormal findings of blood chemistry: Secondary | ICD-10-CM | POA: Diagnosis not present

## 2019-10-15 DIAGNOSIS — M0579 Rheumatoid arthritis with rheumatoid factor of multiple sites without organ or systems involvement: Secondary | ICD-10-CM | POA: Diagnosis not present

## 2019-10-15 DIAGNOSIS — M2550 Pain in unspecified joint: Secondary | ICD-10-CM | POA: Diagnosis not present

## 2019-10-15 DIAGNOSIS — Z79899 Other long term (current) drug therapy: Secondary | ICD-10-CM | POA: Diagnosis not present

## 2019-11-10 DIAGNOSIS — R7989 Other specified abnormal findings of blood chemistry: Secondary | ICD-10-CM | POA: Diagnosis not present

## 2019-11-10 DIAGNOSIS — M0579 Rheumatoid arthritis with rheumatoid factor of multiple sites without organ or systems involvement: Secondary | ICD-10-CM | POA: Diagnosis not present

## 2019-11-12 ENCOUNTER — Other Ambulatory Visit: Payer: Self-pay

## 2019-11-16 ENCOUNTER — Other Ambulatory Visit: Payer: Self-pay

## 2019-11-16 ENCOUNTER — Encounter: Payer: Self-pay | Admitting: Obstetrics & Gynecology

## 2019-11-16 ENCOUNTER — Ambulatory Visit: Payer: BC Managed Care – PPO | Admitting: Obstetrics & Gynecology

## 2019-11-16 VITALS — BP 116/76 | HR 88 | Temp 99.4°F | Resp 12 | Ht 62.5 in | Wt 185.0 lb

## 2019-11-16 DIAGNOSIS — N9489 Other specified conditions associated with female genital organs and menstrual cycle: Secondary | ICD-10-CM | POA: Diagnosis not present

## 2019-11-16 DIAGNOSIS — N926 Irregular menstruation, unspecified: Secondary | ICD-10-CM

## 2019-11-16 DIAGNOSIS — Z23 Encounter for immunization: Secondary | ICD-10-CM | POA: Diagnosis not present

## 2019-11-16 DIAGNOSIS — Z30431 Encounter for routine checking of intrauterine contraceptive device: Secondary | ICD-10-CM | POA: Diagnosis not present

## 2019-11-16 NOTE — Progress Notes (Signed)
GYNECOLOGY  VISIT  CC:   IUD recheck/2nd Gardisil  HPI: 33 y.o. G0P0000 Single Asian female here for IUD recheck.  Had cramping the first two weeks after placement.  She didn't have any cramping after that time.  She's had two bleeding episodes since IUD placement.  The first cycle also had more bleeding.  Second episode of bleeding was 11/07/2019.  First few days had a lot cramping.  Bleeding lasted through 11/13/2019.  Typically takes Tylenol for pain relief but liver enzymes were elevated with rheumatology.  She was advised not to take any tylenol or drink ETOH but last lab test were normal.  So, she didn't use any Tylenol or Motrin.    GYNECOLOGIC HISTORY: Patient's last menstrual period was 11/07/2019. Contraception: IUD  Patient Active Problem List   Diagnosis Date Noted  . Immunization reaction 06/16/2019  . Generalized headaches 10/21/2017  . Snoring 10/21/2017  . Tachycardia 10/21/2017  . Essential hypertension 10/21/2017  . Family history of sleep apnea 12/06/2016  . Obesity 06/08/2016  . Family history of diabetes mellitus 06/07/2016  . High risk medication use 06/07/2016  . Gastroesophageal reflux disease without esophagitis 06/07/2016  . Encounter for health maintenance examination in adult 06/07/2016  . Rheumatoid arthritis ([place]) 06/07/2016  . Work-related stress 11/25/2015  . Generalized anxiety disorder 06/15/2015  . Insomnia 06/15/2015    Past Medical History:  Diagnosis Date  . Chronic headache   . Clotting disorder ([place])   . Dysmenorrhea   . Family history of diabetes mellitus   . Generalized anxiety disorder   . GERD (gastroesophageal reflux disease)   . High risk medication use    Humira, PPI  . Hypertension   . Insomnia   . Obesity   . Rheumatoid arthritis ([place]) 10/2014   started Humira 04/2016; Dr. Gavin Pound  . Seasonal allergies     History reviewed. No pertinent surgical history.  MEDS:   Current Outpatient Medications on File Prior to  Visit  Medication Sig Dispense Refill  . abatacept (ORENCIA) 250 MG injection Inject 250 mg into the vein. Every 4 weeks    . ALPRAZolam (XANAX) 0.5 MG tablet Take 1 tablet (0.5 mg total) by mouth 2 (two) times daily as needed for anxiety. TAKE 1 TABLET BY MOUTH AS NEEDED FOR SLEEP 60 tablet 0  . APPLE CIDER VINEGAR PO Take by mouth.    . Ascorbic Acid (VITAMIN C) 100 MG tablet Take 100 mg daily by mouth.    Marland Kitchen atenolol (TENORMIN) 50 MG tablet TAKE 1 TABLET BY MOUTH EVERY DAY 90 tablet 0  . Biotin 10000 MCG TBDP Take 5,000 mg by mouth.     . cetirizine (ZYRTEC) 10 MG tablet Take 10 mg by mouth daily.    . Cranberry 1000 MG CAPS Take by mouth.    . folic acid (FOLVITE) 1 MG tablet Take 1 mg by mouth 3 (three) times daily.   3  . hydrochlorothiazide (HYDRODIURIL) 25 MG tablet TAKE 1 TABLET BY MOUTH EVERY DAY 90 tablet 0  . hydroxychloroquine (PLAQUENIL) 200 MG tablet Take 2 tablets by mouth daily.    . methotrexate (RHEUMATREX) 2.5 MG tablet 4 TABLETS AS DIRECTED ONCE WEEKLY ORALLY 30 DAYS  2  . Multiple Vitamins-Minerals (MULTIVITAMIN WITH MINERALS) tablet Take 1 tablet by mouth daily.    Marland Kitchen omeprazole (PRILOSEC OTC) 20 MG tablet Take 20 mg by mouth daily.    . RESTASIS 0.05 % ophthalmic emulsion INSTILL 1 DROP INTO BOTH EYES TWICE A DAY    .  traZODone (DESYREL) 50 MG tablet TAKE 1/2-1 TABLETS BY MOUTH AT BEDTIME FOR SLEEP 90 tablet 1  . venlafaxine XR (EFFEXOR-XR) 150 MG 24 hr capsule TAKE 1 CAPSULE (150 MG TOTAL) BY MOUTH DAILY WITH BREAKFAST. 90 capsule 1  . vitamin B-12 (CYANOCOBALAMIN) 100 MCG tablet Take 100 mcg daily by mouth.    . valACYclovir (VALTREX) 500 MG tablet Take 500 mg by mouth 2 (two) times daily.     No current facility-administered medications on file prior to visit.    ALLERGIES: Bactrim [sulfamethoxazole-trimethoprim], Diclofenac, and Tdvax [tetanus-diphtheria toxoids td]  Family History  Problem Relation Age of Onset  . Diabetes Father   . Hypertension Father   .  High Cholesterol Father   . Diabetes Mother   . Hypertension Mother   . High Cholesterol Mother   . Heart disease Maternal Uncle   . Heart disease Maternal Grandmother   . Heart disease Maternal Grandfather   . Cancer Neg Hx     SH:  Single, non smoker  Review of Systems  Genitourinary: Positive for pelvic pain (cramping after IUD placement).    PHYSICAL EXAMINATION:    BP 116/76 (BP Location: Right Arm, Patient Position: Sitting, Cuff Size: Normal)   Pulse 88   Temp 99.4 F (37.4 C) (Temporal)   Resp 12   Ht 5' 2.5" (1.588 m)   Wt 185 lb (83.9 kg)   LMP 11/07/2019   BMI 33.30 kg/m     General appearance: alert, cooperative and appears stated age Lymph:  no inguinal LAD noted  Pelvic: External genitalia:  no lesions              Urethra:  normal appearing urethra with no masses, tenderness or lesions              Bartholins and Skenes: normal                 Vagina: normal appearing vagina with normal color and discharge, no lesions              Cervix: no lesions and 1cm IUD string noted              Bimanual Exam:  Uterus:  normal size, contour, position, consistency, mobility, non-tender              Adnexa: no mass, fullness, tenderness  Chaperone was present for exam.  Assessment: S/p Kyleena IUD placement with irregular bleeding post placement Hypertension, transitioned away from combination OCP use with IUD placement Receiving Gardisil  Plan: Pt is going to monitor bleeding and cramping and communicate to me if bleeding pattern does not normalize and/or if cramping continues to be an issue.  She can now use OTC medication for cramping. Gardisil #2 given today and appt for #3 in 4 months will be scheduled.    ~15 minutes spent with patient >50% of time was in face to face discussion of above.

## 2019-11-19 ENCOUNTER — Other Ambulatory Visit: Payer: Self-pay | Admitting: Medical

## 2019-11-19 DIAGNOSIS — I10 Essential (primary) hypertension: Secondary | ICD-10-CM

## 2019-11-19 DIAGNOSIS — Z79899 Other long term (current) drug therapy: Secondary | ICD-10-CM | POA: Diagnosis not present

## 2019-12-08 DIAGNOSIS — M0579 Rheumatoid arthritis with rheumatoid factor of multiple sites without organ or systems involvement: Secondary | ICD-10-CM | POA: Diagnosis not present

## 2019-12-27 ENCOUNTER — Other Ambulatory Visit: Payer: Self-pay | Admitting: Medical

## 2020-01-05 DIAGNOSIS — M0579 Rheumatoid arthritis with rheumatoid factor of multiple sites without organ or systems involvement: Secondary | ICD-10-CM | POA: Diagnosis not present

## 2020-01-23 ENCOUNTER — Ambulatory Visit: Payer: BC Managed Care – PPO | Attending: Internal Medicine

## 2020-01-23 DIAGNOSIS — Z23 Encounter for immunization: Secondary | ICD-10-CM | POA: Insufficient documentation

## 2020-01-23 NOTE — Progress Notes (Signed)
   Covid-19 Vaccination Clinic  Name:  Guyneth Agius    MRN: [id number] DOB: 14-Sep-1986  01/23/2020  Ms. Sykora was observed post Covid-19 immunization for 15 minutes without incidence. She was provided with Vaccine Information Sheet and instruction to access the V-Safe system.   Ms. Ghio was instructed to call 911 with any severe reactions post vaccine: Marland Kitchen Difficulty breathing  . Swelling of your face and throat  . A fast heartbeat  . A bad rash all over your body  . Dizziness and weakness    Immunizations Administered    Name Date Dose VIS Date Route   Pfizer COVID-19 Vaccine 01/23/2020 12:59 PM 0.3 mL 11/13/2019 Intramuscular   Manufacturer: [place]   Lot: [id number]   [place]: [id number]

## 2020-01-27 ENCOUNTER — Encounter: Payer: Self-pay | Admitting: Medical

## 2020-01-27 ENCOUNTER — Other Ambulatory Visit: Payer: Self-pay

## 2020-01-27 ENCOUNTER — Ambulatory Visit
Admission: RE | Admit: 2020-01-27 | Discharge: 2020-01-27 | Disposition: A | Discharge status: Home / Self Care | Payer: BC Managed Care – PPO | Source: Ambulatory Visit | Attending: Medical | Admitting: Medical

## 2020-01-27 ENCOUNTER — Ambulatory Visit: Payer: BC Managed Care – PPO | Admitting: Medical

## 2020-01-27 VITALS — BP 124/82 | HR 91 | Temp 97.8°F | Ht 62.5 in | Wt 189.4 lb

## 2020-01-27 DIAGNOSIS — S60151A Contusion of right little finger with damage to nail, initial encounter: Secondary | ICD-10-CM

## 2020-01-27 DIAGNOSIS — S62636A Displaced fracture of distal phalanx of right little finger, initial encounter for closed fracture: Secondary | ICD-10-CM | POA: Diagnosis not present

## 2020-01-27 DIAGNOSIS — S6991XA Unspecified injury of right wrist, hand and finger(s), initial encounter: Secondary | ICD-10-CM | POA: Diagnosis not present

## 2020-01-27 NOTE — Patient Instructions (Signed)
Please go to [facility] for your finger xray.   Their hours are 8am - 4:30 pm Monday - Friday.  Take your insurance card with you.  [facility] [phone]  [address], [address] [place], [place] [address]  [address] , [place] [address] 

## 2020-01-27 NOTE — Progress Notes (Signed)
Subjective:  Tamara Vega is a 34 y.o. female who presents for Chief Complaint  Patient presents with  . Hand Injury    weight 20lb fell on left pinky finger      Here for injury.  Date of injury 01/26/2020.    She was in the gym during [place] yesterday exercise program.  She had 25 pound dumbbells on each shoulder holding it with her arms doing lunges.  At one point the right dumbbell slipped off her shoulder out of her hand.  The dumbbell hit hard against her right small finger.  That evening she did ice, elevation, ibuprofen.  She notes purplish coloration of the distal end of the right pinky finger, the entire finger hurts and there is some mild swelling.  She also has a new bend in the last knuckle of her small finger.  No numbness or tingling.  No other injury.  She cannot flex the finger normally.  No other aggravating or relieving factors.    No other c/o.  The following portions of the patient's history were reviewed and updated as appropriate: allergies, current medications, past family history, past medical history, past social history, past surgical history and problem list.  ROS Otherwise as in subjective above    Objective: BP 124/82   Pulse 91   Temp 97.8 F (36.6 C)   Ht 5' 2.5" (1.588 m)   Wt 189 lb 6.4 oz (85.9 kg)   SpO2 98%   BMI 34.09 kg/m   General appearance: alert, no distress, well developed, well nourished Skin: Right small finger distal phalanx with purplish coloration of bruising at the nail bed, the entire finger is tender, there is decreased flexion and extension of the entire finger, there seems to be a flexion deformity of the distal phalanx that is new.  She certainly has pain trying to flex or extend the joints of the finger particularly PIP and DIP.  Otherwise hand nontender no other deformity.  Hand neurovascularly intact    Assessment: Encounter Diagnoses  Name Primary?  . Contusion of right little finger with damage to nail, initial  encounter Yes  . Finger injury, right, initial encounter      Plan: We discussed the injury.  We discussed the findings.  I will send her for x-ray.  I placed a splint in the office.  We discussed ice, rest, elevation, ibuprofen for the next few days OTC limited time.  We discussed the possibility of subungual hematoma.  She does appear to have this now but there is purplish bruising close to the nailbed.   We discussed possible treatment for this if she starts to notice blood pooling pain and swelling under the fingernail.   f/u pending xray.    Tamara Vega was seen today for hand injury.  Diagnoses and all orders for this visit:  Contusion of right little finger with damage to nail, initial encounter -     DG Finger Little Right; Future  Finger injury, right, initial encounter -     DG Finger Little Right; Future    Follow up: pending xray

## 2020-01-28 DIAGNOSIS — M20011 Mallet finger of right finger(s): Secondary | ICD-10-CM | POA: Diagnosis not present

## 2020-01-28 DIAGNOSIS — M79644 Pain in right finger(s): Secondary | ICD-10-CM | POA: Diagnosis not present

## 2020-01-28 DIAGNOSIS — M25641 Stiffness of right hand, not elsewhere classified: Secondary | ICD-10-CM | POA: Diagnosis not present

## 2020-02-02 DIAGNOSIS — M0579 Rheumatoid arthritis with rheumatoid factor of multiple sites without organ or systems involvement: Secondary | ICD-10-CM | POA: Diagnosis not present

## 2020-02-05 DIAGNOSIS — M20011 Mallet finger of right finger(s): Secondary | ICD-10-CM | POA: Diagnosis not present

## 2020-02-05 DIAGNOSIS — S62636D Displaced fracture of distal phalanx of right little finger, subsequent encounter for fracture with routine healing: Secondary | ICD-10-CM | POA: Diagnosis not present

## 2020-02-10 ENCOUNTER — Other Ambulatory Visit: Payer: Self-pay | Admitting: Medical

## 2020-02-10 DIAGNOSIS — I10 Essential (primary) hypertension: Secondary | ICD-10-CM

## 2020-02-16 ENCOUNTER — Ambulatory Visit: Payer: BC Managed Care – PPO | Attending: Internal Medicine

## 2020-02-16 DIAGNOSIS — M25641 Stiffness of right hand, not elsewhere classified: Secondary | ICD-10-CM | POA: Diagnosis not present

## 2020-02-16 DIAGNOSIS — Z23 Encounter for immunization: Secondary | ICD-10-CM

## 2020-02-16 DIAGNOSIS — M79644 Pain in right finger(s): Secondary | ICD-10-CM | POA: Diagnosis not present

## 2020-02-16 DIAGNOSIS — M20011 Mallet finger of right finger(s): Secondary | ICD-10-CM | POA: Diagnosis not present

## 2020-02-16 NOTE — Progress Notes (Signed)
   Covid-19 Vaccination Clinic  Name:  Tamara Vega    MRN: [id number] DOB: 1986-11-10  02/16/2020  Tamara Vega was observed post Covid-19 immunization for 15 minutes without incident. She was provided with Vaccine Information Sheet and instruction to access the V-Safe system.   Tamara Vega was instructed to call 911 with any severe reactions post vaccine: Marland Kitchen Difficulty breathing  . Swelling of face and throat  . A fast heartbeat  . A bad rash all over body  . Dizziness and weakness   Immunizations Administered    Name Date Dose VIS Date Route   Pfizer COVID-19 Vaccine 02/16/2020 12:32 PM 0.3 mL 11/13/2019 Intramuscular   Manufacturer: [place]   Lot: [id number]   [place]: [id number]

## 2020-02-17 DIAGNOSIS — M2550 Pain in unspecified joint: Secondary | ICD-10-CM | POA: Diagnosis not present

## 2020-02-17 DIAGNOSIS — R5383 Other fatigue: Secondary | ICD-10-CM | POA: Diagnosis not present

## 2020-02-17 DIAGNOSIS — M0579 Rheumatoid arthritis with rheumatoid factor of multiple sites without organ or systems involvement: Secondary | ICD-10-CM | POA: Diagnosis not present

## 2020-02-17 DIAGNOSIS — M791 Myalgia, unspecified site: Secondary | ICD-10-CM | POA: Diagnosis not present

## 2020-02-25 DIAGNOSIS — F411 Generalized anxiety disorder: Secondary | ICD-10-CM | POA: Diagnosis not present

## 2020-03-01 DIAGNOSIS — M20011 Mallet finger of right finger(s): Secondary | ICD-10-CM | POA: Diagnosis not present

## 2020-03-01 DIAGNOSIS — M0579 Rheumatoid arthritis with rheumatoid factor of multiple sites without organ or systems involvement: Secondary | ICD-10-CM | POA: Diagnosis not present

## 2020-03-01 DIAGNOSIS — S62636D Displaced fracture of distal phalanx of right little finger, subsequent encounter for fracture with routine healing: Secondary | ICD-10-CM | POA: Diagnosis not present

## 2020-03-14 ENCOUNTER — Other Ambulatory Visit: Payer: Self-pay | Admitting: Medical

## 2020-03-14 DIAGNOSIS — M25641 Stiffness of right hand, not elsewhere classified: Secondary | ICD-10-CM | POA: Diagnosis not present

## 2020-03-14 DIAGNOSIS — M20011 Mallet finger of right finger(s): Secondary | ICD-10-CM | POA: Diagnosis not present

## 2020-03-14 DIAGNOSIS — M79644 Pain in right finger(s): Secondary | ICD-10-CM | POA: Diagnosis not present

## 2020-03-14 DIAGNOSIS — S62636D Displaced fracture of distal phalanx of right little finger, subsequent encounter for fracture with routine healing: Secondary | ICD-10-CM | POA: Diagnosis not present

## 2020-03-16 ENCOUNTER — Ambulatory Visit: Payer: Self-pay

## 2020-03-16 DIAGNOSIS — F411 Generalized anxiety disorder: Secondary | ICD-10-CM | POA: Diagnosis not present

## 2020-03-29 DIAGNOSIS — M0579 Rheumatoid arthritis with rheumatoid factor of multiple sites without organ or systems involvement: Secondary | ICD-10-CM | POA: Diagnosis not present

## 2020-03-29 DIAGNOSIS — R7989 Other specified abnormal findings of blood chemistry: Secondary | ICD-10-CM | POA: Diagnosis not present

## 2020-03-30 DIAGNOSIS — S62636D Displaced fracture of distal phalanx of right little finger, subsequent encounter for fracture with routine healing: Secondary | ICD-10-CM | POA: Diagnosis not present

## 2020-03-30 DIAGNOSIS — M20011 Mallet finger of right finger(s): Secondary | ICD-10-CM | POA: Diagnosis not present

## 2020-03-30 DIAGNOSIS — M79644 Pain in right finger(s): Secondary | ICD-10-CM | POA: Diagnosis not present

## 2020-03-30 DIAGNOSIS — M25641 Stiffness of right hand, not elsewhere classified: Secondary | ICD-10-CM | POA: Diagnosis not present

## 2020-04-06 DIAGNOSIS — F411 Generalized anxiety disorder: Secondary | ICD-10-CM | POA: Diagnosis not present

## 2020-04-19 DIAGNOSIS — S62636D Displaced fracture of distal phalanx of right little finger, subsequent encounter for fracture with routine healing: Secondary | ICD-10-CM | POA: Diagnosis not present

## 2020-04-19 DIAGNOSIS — M20011 Mallet finger of right finger(s): Secondary | ICD-10-CM | POA: Diagnosis not present

## 2020-04-27 DIAGNOSIS — M0579 Rheumatoid arthritis with rheumatoid factor of multiple sites without organ or systems involvement: Secondary | ICD-10-CM | POA: Diagnosis not present

## 2020-04-28 DIAGNOSIS — F411 Generalized anxiety disorder: Secondary | ICD-10-CM | POA: Diagnosis not present

## 2020-05-17 ENCOUNTER — Other Ambulatory Visit: Payer: Self-pay | Admitting: Medical

## 2020-05-17 DIAGNOSIS — I10 Essential (primary) hypertension: Secondary | ICD-10-CM

## 2020-05-23 DIAGNOSIS — S62636D Displaced fracture of distal phalanx of right little finger, subsequent encounter for fracture with routine healing: Secondary | ICD-10-CM | POA: Diagnosis not present

## 2020-05-23 DIAGNOSIS — M25641 Stiffness of right hand, not elsewhere classified: Secondary | ICD-10-CM | POA: Diagnosis not present

## 2020-05-23 DIAGNOSIS — M79644 Pain in right finger(s): Secondary | ICD-10-CM | POA: Diagnosis not present

## 2020-05-23 DIAGNOSIS — M20011 Mallet finger of right finger(s): Secondary | ICD-10-CM | POA: Diagnosis not present

## 2020-05-25 ENCOUNTER — Other Ambulatory Visit: Payer: Self-pay | Admitting: Medical

## 2020-05-26 ENCOUNTER — Telehealth: Payer: Self-pay | Admitting: Medical

## 2020-05-26 DIAGNOSIS — F411 Generalized anxiety disorder: Secondary | ICD-10-CM | POA: Diagnosis not present

## 2020-05-26 NOTE — Telephone Encounter (Signed)
Go ahead and get her on schedule for yearly physical in July.  I just resent her medicine from pharmacy refill request

## 2020-05-26 NOTE — Telephone Encounter (Signed)
[facility] ven[facility]afax[facility]. Please advise [facility] °

## 2020-05-26 NOTE — Telephone Encounter (Signed)
Pt on schedule for CPE on 7/13

## 2020-05-31 DIAGNOSIS — M0579 Rheumatoid arthritis with rheumatoid factor of multiple sites without organ or systems involvement: Secondary | ICD-10-CM | POA: Diagnosis not present

## 2020-06-08 ENCOUNTER — Other Ambulatory Visit: Payer: Self-pay | Admitting: Medical

## 2020-06-08 DIAGNOSIS — I10 Essential (primary) hypertension: Secondary | ICD-10-CM

## 2020-06-09 DIAGNOSIS — F411 Generalized anxiety disorder: Secondary | ICD-10-CM | POA: Diagnosis not present

## 2020-06-14 ENCOUNTER — Other Ambulatory Visit: Payer: Self-pay

## 2020-06-14 ENCOUNTER — Encounter: Payer: Self-pay | Admitting: Medical

## 2020-06-14 ENCOUNTER — Ambulatory Visit: Payer: BC Managed Care – PPO | Admitting: Medical

## 2020-06-14 VITALS — BP 112/80 | HR 74 | Ht 63.0 in | Wt 180.4 lb

## 2020-06-14 DIAGNOSIS — E041 Nontoxic single thyroid nodule: Secondary | ICD-10-CM | POA: Diagnosis not present

## 2020-06-14 DIAGNOSIS — Z0000 Encounter for general adult medical examination without abnormal findings: Secondary | ICD-10-CM

## 2020-06-14 DIAGNOSIS — I10 Essential (primary) hypertension: Secondary | ICD-10-CM | POA: Diagnosis not present

## 2020-06-14 DIAGNOSIS — R7989 Other specified abnormal findings of blood chemistry: Secondary | ICD-10-CM

## 2020-06-14 DIAGNOSIS — M069 Rheumatoid arthritis, unspecified: Secondary | ICD-10-CM

## 2020-06-14 DIAGNOSIS — K219 Gastro-esophageal reflux disease without esophagitis: Secondary | ICD-10-CM

## 2020-06-14 DIAGNOSIS — Z6831 Body mass index (BMI) 31.0-31.9, adult: Secondary | ICD-10-CM

## 2020-06-14 DIAGNOSIS — C73 Malignant neoplasm of thyroid gland: Secondary | ICD-10-CM | POA: Insufficient documentation

## 2020-06-14 DIAGNOSIS — G4700 Insomnia, unspecified: Secondary | ICD-10-CM

## 2020-06-14 DIAGNOSIS — F411 Generalized anxiety disorder: Secondary | ICD-10-CM

## 2020-06-14 DIAGNOSIS — Z79899 Other long term (current) drug therapy: Secondary | ICD-10-CM

## 2020-06-14 DIAGNOSIS — Z13220 Encounter for screening for lipoid disorders: Secondary | ICD-10-CM

## 2020-06-14 DIAGNOSIS — Z975 Presence of (intrauterine) contraceptive device: Secondary | ICD-10-CM | POA: Insufficient documentation

## 2020-06-14 DIAGNOSIS — Z131 Encounter for screening for diabetes mellitus: Secondary | ICD-10-CM | POA: Diagnosis not present

## 2020-06-14 DIAGNOSIS — Z566 Other physical and mental strain related to work: Secondary | ICD-10-CM

## 2020-06-14 NOTE — Progress Notes (Signed)
Subjective:   HPI  Tamara Vega is a 34 y.o. female who presents for a complete physical.  Chief Complaint  Patient presents with  . Annual Exam    with fasting labs    Medical care team includes:   Sees Dr. Jess Barters, gynecology  [facility] rheumatology Joven Mom, Camelia Eng, PA-C here for primary care Dr. Leanora Cover, orthopedics Sees dentist Sees eye doctor   Concerns: Here for annual physical/checkup.     Her biggest concern of late is anxiety.  She has longstanding problems with anxiety.  She has stepped up her counseling visits with Lauren.  She is taking her Effexor, having current use Xanax little bit more of late.  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  She is exercising regularly.  She is considering going back to the gym although her exercises primarily been at home and walking on the [place]  She gets a lot of anxiety the day she goes in for her rheumatoid arthritis infusion medication  Her sleep is not great of late.  She is using the trazodone regularly  She has her my chart pulled up so I can see recent labs done through [facility] rheumatology  Reviewed their medical, surgical, family, social, medication, and allergy history and updated chart as appropriate.  Past Medical History:  Diagnosis Date  . Chronic headache   . Clotting disorder ([place])   . Dysmenorrhea   . Family history of diabetes mellitus   . Generalized anxiety disorder   . GERD (gastroesophageal reflux disease)   . High risk medication use    Humira, PPI  . Hypertension   . Insomnia   . Obesity   . Rheumatoid arthritis ([place]) 10/2014   started Humira 04/2016; Dr. Gavin Pound  . Seasonal allergies     Past Surgical History:  Procedure Laterality Date  . ESOPHAGOGASTRODUODENOSCOPY  01/2010   [place]       Family History  Problem Relation Age of Onset  . Diabetes Father   . Hypertension Father   . High Cholesterol Father   . Diabetes Mother   . Hypertension Mother   . High Cholesterol Mother   . Diabetes Sister   . Hypertension Sister   . Heart disease Maternal Uncle        MI  . Heart disease Maternal Grandmother        MI  . Heart disease Maternal Grandfather        MI  . Cancer Neg Hx      Current Outpatient Medications:  .  abatacept (ORENCIA) 250 MG injection, Inject 250 mg into the vein. Every 4 weeks, Disp: , Rfl:  .  ALPRAZolam (XANAX) 0.5 MG tablet, TAKE 1 TAB BY MOUTH 2 TIMES DAILY AS NEEDED FOR ANXIETY. TAKE 1 TABLET BY MOUTH AS NEEDED FOR SLEEP, Disp: 60 tablet, Rfl: 1 .  APPLE CIDER VINEGAR PO, Take by mouth., Disp: , Rfl:  .  Ascorbic Acid (VITAMIN C) 100 MG tablet, Take 100 mg daily by mouth., Disp: , Rfl:  .  atenolol (TENORMIN) 50 MG tablet, TAKE 1 TABLET BY MOUTH EVERY DAY, Disp: 30 tablet, Rfl: 0 .  Biotin 10000 MCG TBDP, Take 5,000 mg by mouth. , Disp: , Rfl:  .  cetirizine (ZYRTEC) 10 MG tablet, Take 10 mg by mouth daily., Disp: , Rfl:  .  Cranberry 1000  MG CAPS, Take by mouth., Disp: , Rfl:  .  folic acid (FOLVITE) 1 MG tablet, Take 1 mg by mouth 3 (three) times daily. , Disp: , Rfl: 3 .  hydrochlorothiazide (HYDRODIURIL) 25 MG tablet, TAKE 1 TABLET BY MOUTH EVERY DAY, Disp: 30 tablet, Rfl: 0 .  hydroxychloroquine (PLAQUENIL) 200 MG tablet, Take 2 tablets by mouth daily., Disp: , Rfl:  .  levonorgestrel (KYLEENA) 19.5 MG IUD, by Intrauterine route once., Disp: , Rfl:  .  methotrexate (RHEUMATREX) 2.5 MG tablet, 4 TABLETS AS DIRECTED ONCE WEEKLY ORALLY 30 DAYS, Disp: , Rfl: 2 .  Multiple Vitamins-Minerals (MULTIVITAMIN WITH MINERALS) tablet, Take 1 tablet by mouth daily., Disp: , Rfl:  .  omeprazole (PRILOSEC OTC) 20 MG tablet, Take 20 mg by mouth daily., Disp: , Rfl:  .  RESTASIS 0.05 % ophthalmic emulsion, INSTILL 1 DROP INTO BOTH EYES TWICE A DAY,  Disp: , Rfl:  .  traZODone (DESYREL) 50 MG tablet, TAKE 1/2-1 TABLETS BY MOUTH AT BEDTIME FOR SLEEP, Disp: 90 tablet, Rfl: 1 .  venlafaxine XR (EFFEXOR-XR) 150 MG 24 hr capsule, TAKE 1 CAPSULE (150 MG TOTAL) BY MOUTH DAILY WITH BREAKFAST., Disp: 90 capsule, Rfl: 0 .  vitamin B-12 (CYANOCOBALAMIN) 100 MCG tablet, Take 100 mcg daily by mouth., Disp: , Rfl:  .  valACYclovir (VALTREX) 500 MG tablet, Take 500 mg by mouth 2 (two) times daily. (Patient not taking: Reported on 06/14/2020), Disp: , Rfl:   Allergies  Allergen Reactions  . Bactrim [Sulfamethoxazole-Trimethoprim] Swelling    reddness and itching   . Diclofenac     Blood in stool once 2016  . Tdvax [Tetanus-Diphtheria Toxoids Td]     Td vaccine - swollen red painful injection site, fatigue, headache    Review of Systems Constitutional: -fever, -chills, -sweats, -unexpected weight change, -decreased appetite, -fatigue Allergy: -sneezing, -itching, -congestion Dermatology: -changing moles, --rash, -lumps ENT: -runny nose, -ear pain, -sore throat, -hoarseness, -sinus pain, -teeth pain, - ringing in ears, -hearing loss, -nosebleeds Cardiology: -chest pain, -palpitations, -swelling, -difficulty breathing when lying flat, -waking up short of breath Respiratory: -cough, -shortness of breath, -difficulty breathing with exercise or exertion, -wheezing, -coughing up blood Gastroenterology: -abdominal pain, -nausea, -vomiting, -diarrhea, -constipation, -blood in stool, -changes in bowel movement, -difficulty swallowing or eating Hematology: -bleeding, -bruising  Musculoskeletal: +mult joint aches, +muscle aches, -joint swelling, -back pain, -neck pain, -cramping, -changes in gait Ophthalmology: denies vision changes, eye redness, itching, discharge Urology: -burning with urination, -difficulty urinating, -blood in urine, -urinary frequency, -urgency, -incontinence Neurology: -headache, -weakness, -tingling, -numbness, -memory loss, -falls,  -dizziness Psychology: -depressed mood, -agitation, +sleep problems     Objective:   Physical Exam  BP 112/80   Pulse 74   Ht 5\' 3"  (1.6 m)   Wt 180 lb 6.4 oz (81.8 kg)   SpO2 97%   BMI 31.96 kg/m   BP Readings from Last 3 Encounters:  06/14/20 112/80  01/27/20 124/82  11/16/19 116/76   Wt Readings from Last 3 Encounters:  06/14/20 180 lb 6.4 oz (81.8 kg)  01/27/20 189 lb 6.4 oz (85.9 kg)  11/16/19 185 lb (83.9 kg)    General appearance: alert, no distress, WD/W intermittent pain N, [place] female Skin: scattered macules, no worrisome lesions Neck: supple, no lymphadenopathy, thyromegaly, right lower thyroid nodule, no masses, normal ROM, no bruits Chest: non tender, normal shape and expansion Heart: RRR, normal S1, S2, no murmurs Lungs: CTA bilaterally, no wheezes, rhonchi, or rales Abdomen: +bs, soft, non tender, non distended,  no masses, no hepatomegaly, no splenomegaly, no bruits Back: non tender, normal ROM, no scoliosis Musculoskeletal: upper extremities non tender, no obvious deformity, normal ROM throughout, lower extremities non tender, no obvious deformity, normal ROM throughout Extremities: no edema, no cyanosis, no clubbing Pulses: 2+ symmetric, upper and lower extremities, normal cap refill Neurological: alert, oriented x 3, CN2-12 intact, strength normal upper extremities and lower extremities, sensation normal throughout, DTRs 2+ throughout, no cerebellar signs, gait normal Psychiatric: normal affect, behavior normal, pleasant  Breast/gyn/rectal - deferred to gyn    Assessment and Plan :    Encounter Diagnoses  Name Primary?  . Encounter for health maintenance examination in adult Yes  . Screening for diabetes mellitus   . Essential hypertension   . Rheumatoid arthritis, involving unspecified site, unspecified whether rheumatoid factor present ([place])   . High risk medication use   . Work-related stress   . Insomnia, unspecified type   . Generalized  anxiety disorder   . Gastroesophageal reflux disease without esophagitis   . Thyroid nodule   . IUD (intrauterine device) in place   . Elevated LFTs      Physical exam - discussed healthy lifestyle, diet, exercise, preventative care, vaccinations, and addressed their concerns.   See your eye doctor yearly for routine vision care. See your dentist yearly for routine dental care including hygiene visits twice yearly. See your gynecologist yearly for routine gynecological care.  Vaccines: Advised yearly flu shot Up-to-date on tetanus and Covid vaccine   Cancer screenings: I reviewed the up-to-date Pap smear in the chart   She had a copy of her labs done in May from rheumatology.  CBC normal except for mildly elevated eosinophils.  Her comprehensive metabolic panel showed slightly elevated ALT, slightly elevated glucose.  However on several labs over the past year her BUN and CO2 have been elevated.  Also back to several months ago her liver tests were markedly elevated more including ALT and AST.  There is likely some suspicion of fatty liver disease however she is on high risk medications as well.  We discussed getting an ultrasound of the abdomen   Other concerns Anxiety-continue counseling.  She is having to use Xanax a little bit more of late.  Pending labs consider modification medication in relation to blood pressure lab adjustments as well as we likely will stop hydrochlorothiazide.  Continue exercise, other coping mechanisms  Elevated liver test-discussed possibly getting abdominal ultrasound given elevated LFTs on several repeat labs in past year  Hypertension-consider cutting out the fluid pill portion of her medication.  Consider other alternatives.  Some recent labs showed elevated BUN and carbon dioxide on multiple rechecks.  She will also start monitoring her blood pressure a few times per week and get me numbers  Rheumatoid arthritis-managed by rheumatology.  Reviewed  rheumatology recent office note and labs. We discussed medical potential risks and complications associated with rheumatoid arthritis  Thyroid nodule-labs today, reviewed last ultrasound.  We will plan to repeat ultrasound in either now or in 6 months pending labs  GERD-stable on current therapy  High risk medications-continue routine surveillance and lab monitoring  Obesity-continue efforts to weight loss through healthy diet and exercise  Insomnia-continue trazodone, work on stress reduction sleep hygiene  .   Lugenia was seen today for annual exam.  Diagnoses and all orders for this visit:  Encounter for health maintenance examination in adult -     Hemoglobin A1c -     TSH -  T4, free -     Lipid panel -     VITAMIN D 25 Hydroxy (Vit-D Deficiency, Fractures)  Screening for diabetes mellitus -     Hemoglobin A1c  Essential hypertension  Rheumatoid arthritis, involving unspecified site, unspecified whether rheumatoid factor present (H[facility])  High risk medication use -     US Abdomen Complete; Future  Work-related stress  Insomnia, unspecified type  Generalized anxiety disorder  Gastroesophageal reflux disease without esophagitis  Thyroid nodule -     TSH -     T4, free  IUD (intrauterine device) in place  Elevated LFTs -     US Abdomen Complete; Future  Follow-up pending labs

## 2020-06-15 LAB — LIPID PANEL
Chol/HDL Ratio: 2.1 ratio (ref 0.0–4.4)
Cholesterol, Total: 203 mg/dL — ABNORMAL HIGH (ref 100–199)
HDL: 99 mg/dL (ref >39)
LDL Chol Calc (NIH): 89 mg/dL (ref 0–99)
Triglycerides: 89 mg/dL (ref 0–149)
VLDL Cholesterol Cal: 15 mg/dL (ref 5–40)

## 2020-06-15 LAB — HEMOGLOBIN A1C
Est. average glucose Bld gHb Est-mCnc: 114 mg/dL
Hgb A1c MFr Bld: 5.6 % (ref 4.8–5.6)

## 2020-06-15 LAB — T4, FREE: Free T4: 1.17 ng/dL (ref 0.82–1.77)

## 2020-06-15 LAB — TSH: TSH: 2.83 u[IU]/mL (ref 0.450–4.500)

## 2020-06-15 LAB — VITAMIN D 25 HYDROXY (VIT D DEFICIENCY, FRACTURES): Vit D, 25-Hydroxy: 32.9 ng/mL (ref 30.0–100.0)

## 2020-06-17 ENCOUNTER — Other Ambulatory Visit: Payer: Self-pay | Admitting: Medical

## 2020-06-17 DIAGNOSIS — I10 Essential (primary) hypertension: Secondary | ICD-10-CM

## 2020-06-17 MED ORDER — BUSPIRONE HCL 5 MG PO TABS: 5.0000 mg | ORAL_TABLET | Freq: Two times a day (BID) | ORAL | 1 refills | Status: DC

## 2020-06-17 MED ORDER — ATENOLOL 50 MG PO TABS: 50.0000 mg | ORAL_TABLET | Freq: Every day | ORAL | 3 refills | Status: DC

## 2020-06-17 MED ORDER — ALPRAZOLAM 0.5 MG PO TABS: ORAL_TABLET | ORAL | 0 refills | Status: DC

## 2020-06-17 MED ORDER — LOSARTAN POTASSIUM 25 MG PO TABS: 25.0000 mg | ORAL_TABLET | Freq: Every day | ORAL | 2 refills | Status: DC

## 2020-06-17 NOTE — Telephone Encounter (Signed)
[facility] to [facility]. Please advise [facility] 

## 2020-06-22 DIAGNOSIS — S62636D Displaced fracture of distal phalanx of right little finger, subsequent encounter for fracture with routine healing: Secondary | ICD-10-CM | POA: Diagnosis not present

## 2020-06-23 DIAGNOSIS — F411 Generalized anxiety disorder: Secondary | ICD-10-CM | POA: Diagnosis not present

## 2020-06-28 DIAGNOSIS — M0579 Rheumatoid arthritis with rheumatoid factor of multiple sites without organ or systems involvement: Secondary | ICD-10-CM | POA: Diagnosis not present

## 2020-07-05 DIAGNOSIS — F411 Generalized anxiety disorder: Secondary | ICD-10-CM | POA: Diagnosis not present

## 2020-07-14 ENCOUNTER — Other Ambulatory Visit: Payer: Self-pay | Admitting: Medical

## 2020-07-19 DIAGNOSIS — Z6831 Body mass index (BMI) 31.0-31.9, adult: Secondary | ICD-10-CM | POA: Insufficient documentation

## 2020-07-19 DIAGNOSIS — Z13220 Encounter for screening for lipoid disorders: Secondary | ICD-10-CM | POA: Insufficient documentation

## 2020-07-25 DIAGNOSIS — F411 Generalized anxiety disorder: Secondary | ICD-10-CM | POA: Diagnosis not present

## 2020-07-26 DIAGNOSIS — M0579 Rheumatoid arthritis with rheumatoid factor of multiple sites without organ or systems involvement: Secondary | ICD-10-CM | POA: Diagnosis not present

## 2020-07-26 DIAGNOSIS — Z79899 Other long term (current) drug therapy: Secondary | ICD-10-CM | POA: Diagnosis not present

## 2020-07-27 DIAGNOSIS — S62636D Displaced fracture of distal phalanx of right little finger, subsequent encounter for fracture with routine healing: Secondary | ICD-10-CM | POA: Diagnosis not present

## 2020-08-02 DIAGNOSIS — F411 Generalized anxiety disorder: Secondary | ICD-10-CM | POA: Diagnosis not present

## 2020-08-22 DIAGNOSIS — R5383 Other fatigue: Secondary | ICD-10-CM | POA: Diagnosis not present

## 2020-08-22 DIAGNOSIS — M0579 Rheumatoid arthritis with rheumatoid factor of multiple sites without organ or systems involvement: Secondary | ICD-10-CM | POA: Diagnosis not present

## 2020-08-22 DIAGNOSIS — M2550 Pain in unspecified joint: Secondary | ICD-10-CM | POA: Diagnosis not present

## 2020-08-22 DIAGNOSIS — M791 Myalgia, unspecified site: Secondary | ICD-10-CM | POA: Diagnosis not present

## 2020-08-23 DIAGNOSIS — M0579 Rheumatoid arthritis with rheumatoid factor of multiple sites without organ or systems involvement: Secondary | ICD-10-CM | POA: Diagnosis not present

## 2020-08-25 ENCOUNTER — Other Ambulatory Visit: Payer: Self-pay | Admitting: Medical

## 2020-09-08 ENCOUNTER — Other Ambulatory Visit: Payer: Self-pay | Admitting: Medical

## 2020-09-08 DIAGNOSIS — F411 Generalized anxiety disorder: Secondary | ICD-10-CM | POA: Diagnosis not present

## 2020-09-20 DIAGNOSIS — M0579 Rheumatoid arthritis with rheumatoid factor of multiple sites without organ or systems involvement: Secondary | ICD-10-CM | POA: Diagnosis not present

## 2020-09-29 DIAGNOSIS — F411 Generalized anxiety disorder: Secondary | ICD-10-CM | POA: Diagnosis not present

## 2020-10-06 ENCOUNTER — Other Ambulatory Visit: Payer: Self-pay | Admitting: Family Medicine

## 2020-10-06 NOTE — Telephone Encounter (Signed)
[facility] buspar. Please advise [facility]

## 2020-10-18 DIAGNOSIS — R5383 Other fatigue: Secondary | ICD-10-CM | POA: Diagnosis not present

## 2020-10-18 DIAGNOSIS — M0579 Rheumatoid arthritis with rheumatoid factor of multiple sites without organ or systems involvement: Secondary | ICD-10-CM | POA: Diagnosis not present

## 2020-10-18 DIAGNOSIS — Z111 Encounter for screening for respiratory tuberculosis: Secondary | ICD-10-CM | POA: Diagnosis not present

## 2020-10-18 DIAGNOSIS — Z79899 Other long term (current) drug therapy: Secondary | ICD-10-CM | POA: Diagnosis not present

## 2020-10-21 DIAGNOSIS — F411 Generalized anxiety disorder: Secondary | ICD-10-CM | POA: Diagnosis not present

## 2020-11-08 DIAGNOSIS — F411 Generalized anxiety disorder: Secondary | ICD-10-CM | POA: Diagnosis not present

## 2020-11-15 DIAGNOSIS — M0579 Rheumatoid arthritis with rheumatoid factor of multiple sites without organ or systems involvement: Secondary | ICD-10-CM | POA: Diagnosis not present

## 2020-11-19 ENCOUNTER — Other Ambulatory Visit: Payer: Self-pay | Admitting: Medical

## 2020-11-21 DIAGNOSIS — Z20822 Contact with and (suspected) exposure to covid-19: Secondary | ICD-10-CM | POA: Diagnosis not present

## 2020-11-22 ENCOUNTER — Telehealth: Payer: Self-pay | Admitting: Medical

## 2020-11-22 ENCOUNTER — Encounter: Payer: Self-pay | Admitting: Medical

## 2020-11-22 ENCOUNTER — Telehealth: Payer: BC Managed Care – PPO | Admitting: Medical

## 2020-11-22 ENCOUNTER — Other Ambulatory Visit: Payer: Self-pay

## 2020-11-22 VITALS — Ht 62.75 in | Wt 187.0 lb

## 2020-11-22 DIAGNOSIS — I10 Essential (primary) hypertension: Secondary | ICD-10-CM

## 2020-11-22 DIAGNOSIS — Z566 Other physical and mental strain related to work: Secondary | ICD-10-CM

## 2020-11-22 DIAGNOSIS — Z79899 Other long term (current) drug therapy: Secondary | ICD-10-CM

## 2020-11-22 DIAGNOSIS — R7989 Other specified abnormal findings of blood chemistry: Secondary | ICD-10-CM

## 2020-11-22 DIAGNOSIS — G4700 Insomnia, unspecified: Secondary | ICD-10-CM

## 2020-11-22 DIAGNOSIS — F411 Generalized anxiety disorder: Secondary | ICD-10-CM

## 2020-11-22 DIAGNOSIS — Z975 Presence of (intrauterine) contraceptive device: Secondary | ICD-10-CM

## 2020-11-22 NOTE — Progress Notes (Signed)
Subjective:     Patient ID: Tamara Vega, female   DOB: [birth year], 34 y.o.   MRN: [id number]  This visit type was conducted due to national recommendations for restrictions regarding the COVID-19 Pandemic (e.g. social distancing) in an effort to limit this patient's exposure and mitigate transmission in our community.  Due to their co-morbid illnesses, this patient is at least at moderate risk for complications without adequate follow up.  This format is felt to be most appropriate for this patient at this time.    Documentation for virtual audio and video telecommunications through [place] encounter:  The patient was located at home. The provider was located in the office. The patient did consent to this visit and is aware of possible charges through their insurance for this visit.  The other persons participating in this telemedicine service were none. Time spent on call was 20 minutes and in review of previous records 20 minutes total.  This virtual service is not related to other E/M service within previous 7 days.   HPI Chief Complaint  Patient presents with  . Medication Management    Wants to discuss medications. Not really benefiting with Buspar.    Virtual consult for medication management.  I attempted to connect to video and tried to call her.  No answer.  She ended up emailing me back that she wanted to get her co-pay back for today since we did not get to talk.     Precharting notes:  Apparently she does not feel BuSpar is helpful started at her last visit.  Her last visit with a physical back in July.    From her July visit: She continues on Xanax for both sleep and anxiety.  She continues on Effexor.   She continues to see her counselor Tamara Vega.  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  She is exercising regularly.  She is considering going back to the  gym although her exercises primarily been at home and walking on the [place]  She gets a lot of anxiety the day she goes in for her rheumatoid arthritis infusion medication  Her sleep is not great of late.  She is using the trazodone regularly  Insomnia -she continues on Xanax and trazodone 50mg      Last visit we were also considering ultrasound of liver given history of elevated LFTs.  This has not been done yet.  HTN -last visit we discontinued diuretic given elevations in BUN and changes in CO2 levels.  So currently she should be taking atenolol and losartan only.        Review of Systems As in subjective    Objective:   Physical Exam Due to coronavirus pandemic stay at home measures, patient visit was virtual and they were not examined in person.   Ht 5' 2.75" (1.594 m)   Wt 187 lb (84.8 kg)   BMI 33.39 kg/m       Assessment:     Encounter Diagnoses  Name Primary?  . Generalized anxiety disorder Yes  . Elevated LFTs   . High risk medication use   . Insomnia, unspecified type   . IUD (intrauterine device) in place   . Work-related stress   . Essential hypertension        Plan:     Unfortunately we did not get to talk today.   We will ask her to follow-up and reschedule.  Need to discuss  increasing BuSpar versus it may be time to establish with psychiatry  We need to discuss ultrasound of liver  Recheck on blood pressure

## 2020-11-22 NOTE — Telephone Encounter (Signed)
Tamara Vega, Here is someone for a Feb appointment (or March) at the new location.  Thanks.  Vinnie Level

## 2020-11-22 NOTE — Telephone Encounter (Signed)
Please see her message.  Refund her co-pay.  She still needs a follow-up.  The best way to avoid me running behind is to make an appointment first thing in the morning or right after lunch at 130.. So you can offer her one of those choices.

## 2020-11-23 NOTE — Telephone Encounter (Signed)
Refunded pt money and left pt message

## 2020-12-02 ENCOUNTER — Other Ambulatory Visit: Payer: Self-pay | Admitting: Medical

## 2020-12-08 ENCOUNTER — Other Ambulatory Visit: Payer: Self-pay | Admitting: Medical

## 2020-12-08 DIAGNOSIS — Z79899 Other long term (current) drug therapy: Secondary | ICD-10-CM | POA: Diagnosis not present

## 2020-12-08 DIAGNOSIS — F411 Generalized anxiety disorder: Secondary | ICD-10-CM | POA: Diagnosis not present

## 2020-12-14 DIAGNOSIS — Z20822 Contact with and (suspected) exposure to covid-19: Secondary | ICD-10-CM | POA: Diagnosis not present

## 2020-12-15 DIAGNOSIS — M0579 Rheumatoid arthritis with rheumatoid factor of multiple sites without organ or systems involvement: Secondary | ICD-10-CM | POA: Diagnosis not present

## 2020-12-16 DIAGNOSIS — Z20822 Contact with and (suspected) exposure to covid-19: Secondary | ICD-10-CM | POA: Diagnosis not present

## 2020-12-20 ENCOUNTER — Other Ambulatory Visit: Payer: Self-pay | Admitting: Medical

## 2020-12-21 DIAGNOSIS — Z20822 Contact with and (suspected) exposure to covid-19: Secondary | ICD-10-CM | POA: Diagnosis not present

## 2020-12-22 DIAGNOSIS — Z20822 Contact with and (suspected) exposure to covid-19: Secondary | ICD-10-CM | POA: Diagnosis not present

## 2020-12-22 DIAGNOSIS — Z03818 Encounter for observation for suspected exposure to other biological agents ruled out: Secondary | ICD-10-CM | POA: Diagnosis not present

## 2021-01-12 NOTE — Telephone Encounter (Signed)
Called patient and left message to please call the office back to scheulde her appointment with Dr.Miller.

## 2021-01-13 DIAGNOSIS — S21052A Open bite of left breast, initial encounter: Secondary | ICD-10-CM | POA: Diagnosis not present

## 2021-01-13 DIAGNOSIS — Z789 Other specified health status: Secondary | ICD-10-CM | POA: Diagnosis not present

## 2021-01-13 DIAGNOSIS — W503XXA Accidental bite by another person, initial encounter: Secondary | ICD-10-CM | POA: Diagnosis not present

## 2021-01-16 ENCOUNTER — Other Ambulatory Visit: Payer: Self-pay | Admitting: Medical

## 2021-01-17 DIAGNOSIS — Z79899 Other long term (current) drug therapy: Secondary | ICD-10-CM | POA: Diagnosis not present

## 2021-01-17 DIAGNOSIS — M0579 Rheumatoid arthritis with rheumatoid factor of multiple sites without organ or systems involvement: Secondary | ICD-10-CM | POA: Diagnosis not present

## 2021-01-19 DIAGNOSIS — F411 Generalized anxiety disorder: Secondary | ICD-10-CM | POA: Diagnosis not present

## 2021-02-14 ENCOUNTER — Other Ambulatory Visit: Payer: Self-pay | Admitting: Medical

## 2021-02-14 DIAGNOSIS — M0579 Rheumatoid arthritis with rheumatoid factor of multiple sites without organ or systems involvement: Secondary | ICD-10-CM | POA: Diagnosis not present

## 2021-02-20 DIAGNOSIS — Z79899 Other long term (current) drug therapy: Secondary | ICD-10-CM | POA: Diagnosis not present

## 2021-02-20 DIAGNOSIS — F411 Generalized anxiety disorder: Secondary | ICD-10-CM | POA: Diagnosis not present

## 2021-02-20 DIAGNOSIS — M791 Myalgia, unspecified site: Secondary | ICD-10-CM | POA: Diagnosis not present

## 2021-02-20 DIAGNOSIS — M2550 Pain in unspecified joint: Secondary | ICD-10-CM | POA: Diagnosis not present

## 2021-02-20 DIAGNOSIS — M0579 Rheumatoid arthritis with rheumatoid factor of multiple sites without organ or systems involvement: Secondary | ICD-10-CM | POA: Diagnosis not present

## 2021-03-04 ENCOUNTER — Other Ambulatory Visit: Payer: Self-pay | Admitting: Medical

## 2021-03-06 NOTE — Telephone Encounter (Signed)
Is this ok to refill.  Respond to [facility]

## 2021-03-06 NOTE — Telephone Encounter (Signed)
Schedule med check or CPX if last CPX > 1 year ago.   Make sure appt is a time that we shouldn't be running late like 1st morning or 1st afternoon, then send 30 day supply only

## 2021-03-07 NOTE — Telephone Encounter (Signed)
Did you send in the 30-day supply as well?  Usually when you route this back to me I just hit done, but it looked as if the refill was still open.  I had already given the okay for you to refill 30-day supply

## 2021-03-07 NOTE — Telephone Encounter (Signed)
Pt has appointment scheduled for July.

## 2021-03-20 DIAGNOSIS — M0579 Rheumatoid arthritis with rheumatoid factor of multiple sites without organ or systems involvement: Secondary | ICD-10-CM | POA: Diagnosis not present

## 2021-03-27 DIAGNOSIS — F411 Generalized anxiety disorder: Secondary | ICD-10-CM | POA: Diagnosis not present

## 2021-03-28 DIAGNOSIS — Z20822 Contact with and (suspected) exposure to covid-19: Secondary | ICD-10-CM | POA: Diagnosis not present

## 2021-04-01 ENCOUNTER — Other Ambulatory Visit: Payer: Self-pay | Admitting: Medical

## 2021-04-11 ENCOUNTER — Other Ambulatory Visit: Payer: Self-pay | Admitting: Medical

## 2021-04-17 DIAGNOSIS — M0579 Rheumatoid arthritis with rheumatoid factor of multiple sites without organ or systems involvement: Secondary | ICD-10-CM | POA: Diagnosis not present

## 2021-04-21 IMAGING — CR DG FINGER LITTLE 2+V*R*
3 series · 3 of 3 positions shown · non-contrast
Comparison: None.

CLINICAL DATA: Trauma to the right fifth finger.

EXAM:
RIGHT LITTLE FINGER 2+V

[x finger pa right]
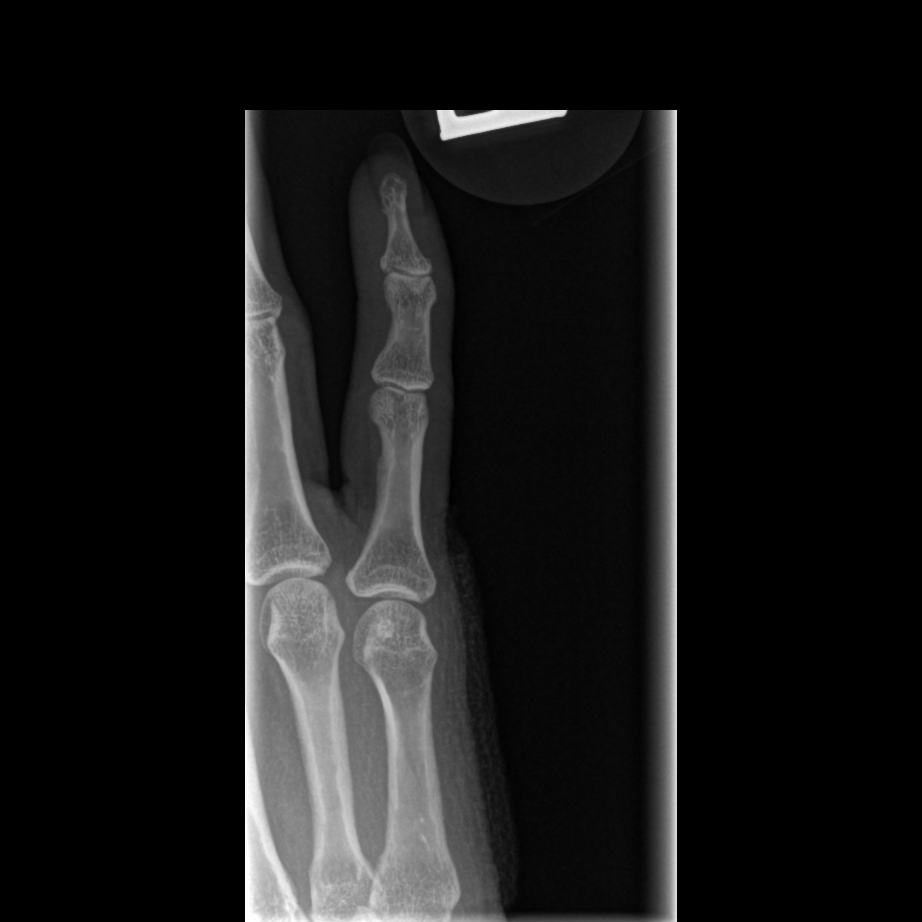

[x finger obl. right]
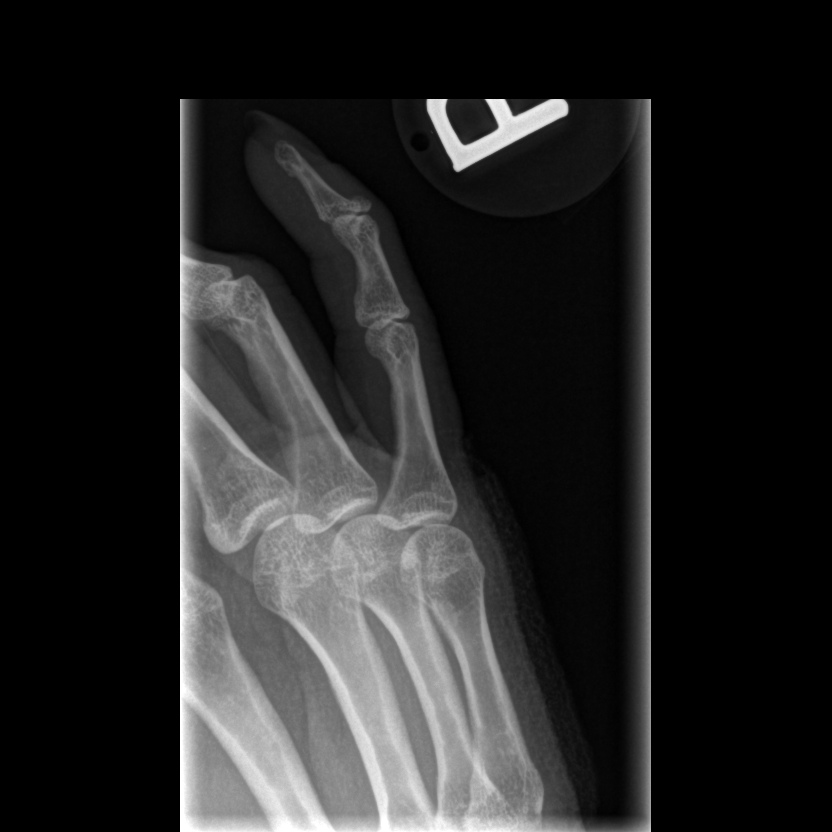

[x finger lateral right]
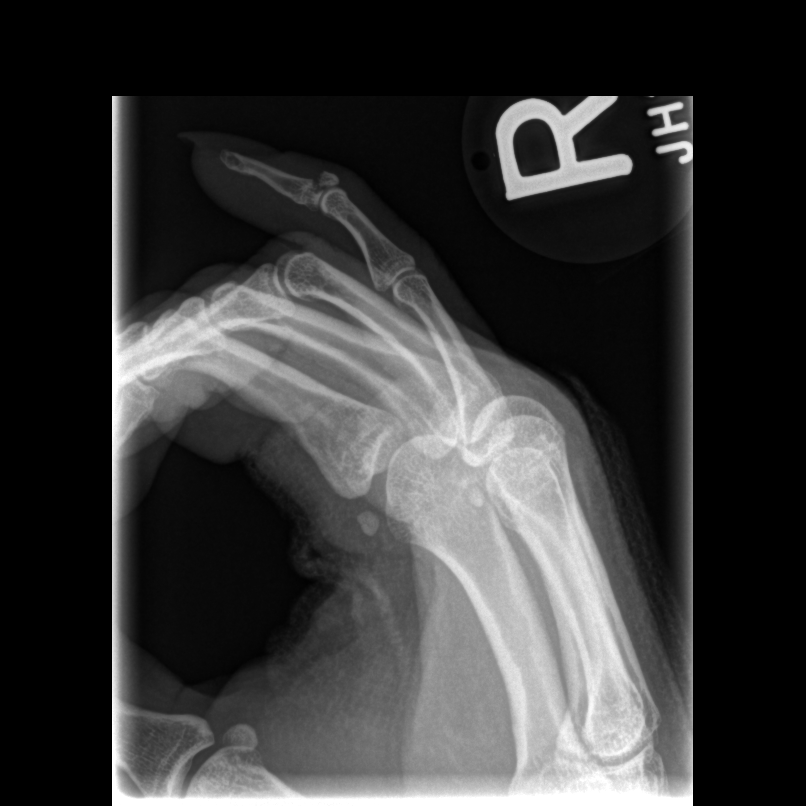

[3 of 3 positions shown; findings below may reference images not displayed]

FINDINGS: There is a fracture through the base of the fifth distal phalanx.
The fracture appears to be comminuted with displacement of the
dorsal fragment. This fracture is best assessed on lateral imaging.
No other fractures are identified.
IMPRESSION: Comminuted fracture through the base of the distal fifth phalanx
with displacement of the dorsal fracture fragment.

## 2021-04-24 DIAGNOSIS — F411 Generalized anxiety disorder: Secondary | ICD-10-CM | POA: Diagnosis not present

## 2021-04-27 ENCOUNTER — Other Ambulatory Visit: Payer: Self-pay | Admitting: Medical

## 2021-05-07 ENCOUNTER — Other Ambulatory Visit: Payer: Self-pay | Admitting: Medical

## 2021-05-09 DIAGNOSIS — M2550 Pain in unspecified joint: Secondary | ICD-10-CM | POA: Diagnosis not present

## 2021-05-10 NOTE — Telephone Encounter (Signed)
Need to schedule med check.   Since last visit was canceled due to schedule conflict, I recommend this visit be either first in morning or first in afternoon to ensure we are both on time.   I can't predict being behind or not mid morning or mid afternoon

## 2021-05-11 NOTE — Telephone Encounter (Signed)
Pt is scheduled for CPE on 7/26

## 2021-05-11 NOTE — Telephone Encounter (Signed)
You don't have any 30 min appts until July 15th

## 2021-05-12 ENCOUNTER — Other Ambulatory Visit: Payer: Self-pay | Admitting: Medical

## 2021-05-12 DIAGNOSIS — R197 Diarrhea, unspecified: Secondary | ICD-10-CM | POA: Diagnosis not present

## 2021-05-15 ENCOUNTER — Other Ambulatory Visit: Payer: Self-pay | Admitting: Medical

## 2021-05-15 ENCOUNTER — Telehealth: Payer: Self-pay | Admitting: Medical

## 2021-05-15 DIAGNOSIS — F411 Generalized anxiety disorder: Secondary | ICD-10-CM

## 2021-05-15 NOTE — Telephone Encounter (Signed)
Please call patient.  I am going to go ahead and make referral to psychiatry to help manage her medications regarding anxiety.  I am not a psychiatrist, and I have worked with her for a while now on medications to help with anxiety.  However, at this time I think it would be more appropriate for her to have psychiatry manage that part of her care.    I recently refilled medications per pharmacy request, and I'll send refill on Xanax today, but I want her to establish with psychiatry.     I put in referral.  However ,if she wants to find her on psychiatrist, that is fine too.   Tamara Vega

## 2021-05-16 NOTE — Telephone Encounter (Signed)
Patient has been informed and agrees with referral to psychiatry.

## 2021-05-17 DIAGNOSIS — M0579 Rheumatoid arthritis with rheumatoid factor of multiple sites without organ or systems involvement: Secondary | ICD-10-CM | POA: Diagnosis not present

## 2021-05-22 DIAGNOSIS — F411 Generalized anxiety disorder: Secondary | ICD-10-CM | POA: Diagnosis not present

## 2021-05-23 ENCOUNTER — Telehealth: Payer: Self-pay | Admitting: Medical

## 2021-05-23 ENCOUNTER — Encounter: Payer: Self-pay | Admitting: Physician Assistant

## 2021-05-23 NOTE — Telephone Encounter (Signed)
Got pt scheduled 

## 2021-05-23 NOTE — Telephone Encounter (Signed)
See my chart message.  Schedule visit, virtual hopefully with somebody tomorrow if possible

## 2021-05-24 ENCOUNTER — Telehealth: Payer: BC Managed Care – PPO | Admitting: Family Medicine

## 2021-05-24 ENCOUNTER — Other Ambulatory Visit: Payer: Self-pay

## 2021-05-24 ENCOUNTER — Encounter: Payer: Self-pay | Admitting: Family Medicine

## 2021-05-24 VITALS — Temp 100.5°F | Wt 184.9 lb

## 2021-05-24 DIAGNOSIS — U071 COVID-19: Secondary | ICD-10-CM | POA: Diagnosis not present

## 2021-05-24 DIAGNOSIS — M069 Rheumatoid arthritis, unspecified: Secondary | ICD-10-CM

## 2021-05-24 NOTE — Progress Notes (Signed)
   Subjective:    Patient ID: Tamara Vega, female    DOB: [birth year], 35 y.o.   MRN: [id number]  HPI Documentation for virtual audio and video telecommunications through [place] encounter: The patient was located at home. 2 patient identifiers used.  The provider was located in the office. The patient did consent to this visit and is aware of possible charges through their insurance for this visit. The other persons participating in this telemedicine service were none. Time spent on call was 5 minutes and in review of previous records >20 minutes total for counseling and coordination of care. This virtual service is not related to other E/M service within previous 7 days.  She states that yesterday she developed malaise, sore throat, fever and chills and did test positive for COVID.  No coughing, smell or taste change.  She has had 3 of the COVID vaccinations.  She does have underlying rheumatoid arthritis and is on a DMARD.  Review of Systems     Objective:   Physical Exam Alert and in no distress otherwise not examined       Assessment & Plan:   Rheumatoid arthritis, involving unspecified site, unspecified whether rheumatoid factor present ([place])  COVID-19 I explained at this time we can actively monitor the situation.  She is to treat her symptoms with over-the-counter medications for the fever chills and coughing.  Cautioned on the worsening of cough, shortness of breath and fever to call and we will probably place her on Paxil did.  She was comfortable with that.

## 2021-05-29 ENCOUNTER — Encounter: Payer: Self-pay | Admitting: Medical

## 2021-05-31 ENCOUNTER — Other Ambulatory Visit: Payer: Self-pay | Admitting: Medical

## 2021-06-07 DIAGNOSIS — M2550 Pain in unspecified joint: Secondary | ICD-10-CM | POA: Diagnosis not present

## 2021-06-07 DIAGNOSIS — M0579 Rheumatoid arthritis with rheumatoid factor of multiple sites without organ or systems involvement: Secondary | ICD-10-CM | POA: Diagnosis not present

## 2021-06-07 DIAGNOSIS — Z79899 Other long term (current) drug therapy: Secondary | ICD-10-CM | POA: Diagnosis not present

## 2021-06-14 DIAGNOSIS — M0579 Rheumatoid arthritis with rheumatoid factor of multiple sites without organ or systems involvement: Secondary | ICD-10-CM | POA: Diagnosis not present

## 2021-06-19 ENCOUNTER — Ambulatory Visit (INDEPENDENT_AMBULATORY_CARE_PROVIDER_SITE_OTHER): Payer: BC Managed Care – PPO | Admitting: Obstetrics & Gynecology

## 2021-06-19 ENCOUNTER — Other Ambulatory Visit (HOSPITAL_COMMUNITY)
Admission: RE | Admit: 2021-06-19 | Discharge: 2021-06-19 | Disposition: A | Discharge status: Home / Self Care | Payer: BC Managed Care – PPO | Source: Ambulatory Visit | Attending: Obstetrics & Gynecology | Admitting: Obstetrics & Gynecology

## 2021-06-19 ENCOUNTER — Encounter (HOSPITAL_BASED_OUTPATIENT_CLINIC_OR_DEPARTMENT_OTHER): Payer: Self-pay | Admitting: Obstetrics & Gynecology

## 2021-06-19 ENCOUNTER — Other Ambulatory Visit: Payer: Self-pay

## 2021-06-19 VITALS — BP 131/79 | HR 87 | Ht 62.0 in | Wt 191.2 lb

## 2021-06-19 DIAGNOSIS — B977 Papillomavirus as the cause of diseases classified elsewhere: Secondary | ICD-10-CM

## 2021-06-19 DIAGNOSIS — F411 Generalized anxiety disorder: Secondary | ICD-10-CM | POA: Diagnosis not present

## 2021-06-19 DIAGNOSIS — Z124 Encounter for screening for malignant neoplasm of cervix: Secondary | ICD-10-CM

## 2021-06-19 DIAGNOSIS — Z113 Encounter for screening for infections with a predominantly sexual mode of transmission: Secondary | ICD-10-CM | POA: Diagnosis not present

## 2021-06-19 DIAGNOSIS — Z01419 Encounter for gynecological examination (general) (routine) without abnormal findings: Secondary | ICD-10-CM | POA: Diagnosis not present

## 2021-06-19 DIAGNOSIS — E041 Nontoxic single thyroid nodule: Secondary | ICD-10-CM

## 2021-06-19 DIAGNOSIS — Z975 Presence of (intrauterine) contraceptive device: Secondary | ICD-10-CM | POA: Diagnosis not present

## 2021-06-19 DIAGNOSIS — N926 Irregular menstruation, unspecified: Secondary | ICD-10-CM

## 2021-06-19 NOTE — Progress Notes (Signed)
35 y.o. G0P0000 Single Asian female here for annual exam.  Doing well.  Having irregular bleeding with IUD.  For example, in June had bleeding for 2 days ( June 5 and 6) and then again June 26-7/7.  Prior to that had no bleeding since March.  Last week of June was very heavy.  Has not tried to feel her string.    No LMP recorded. (Menstrual status: IUD).          Sexually active: No.  The current method of family planning is IUD.    Exercising: Yes.     Smoker:  no  Health Maintenance: Pap:  09/10/2019 Positive HPV History of abnormal Pap:  high risk HPV positive 09/2019 MMG:  screening guidelines reviewed Colonoscopy:  screening guidelines reviewed TDaP:  2020 Hep C testing: obtained today Screening Labs: desires STD testing   reports that she has never smoked. She has never used smokeless tobacco. She reports current alcohol use of about 3.0 standard drinks of alcohol per week. She reports that she does not use drugs.  Past Medical History:  Diagnosis Date   Chronic headache    Clotting disorder [facility])    elementary age nose bleeds   Dysmenorrhea    Family history of diabetes mellitus    Generalized anxiety disorder    GERD (gastroesophageal reflux disease)    High risk medication use    Humira, PPI   Hypertension    Insomnia    Obesity    Rheumatoid arthritis ([place]) 10/2014   started Humira 04/2016; Dr. Gavin Pound   Seasonal allergies     Past Surgical History:  Procedure Laterality Date   ESOPHAGOGASTRODUODENOSCOPY  01/2010   [place], [place]    Current Outpatient Medications  Medication Sig Dispense Refill   abatacept (ORENCIA) 250 MG injection Inject 250 mg into the vein. Every 4 weeks     ALPRAZolam (XANAX) 0.5 MG tablet TAKE 1 TABLET BY MOUTH TWICE A DAY AS NEEDED FOR ANXIETY OR SLEEP 60 tablet 0   APPLE CIDER VINEGAR PO Take by mouth.     Ascorbic Acid (VITAMIN C) 100 MG tablet Take 100 mg daily by mouth.     atenolol (TENORMIN) 50 MG tablet Take 1 tablet (50 mg  total) by mouth daily. 90 tablet 3   Biotin 10000 MCG TBDP Take 5,000 mg by mouth.      busPIRone (BUSPAR) 5 MG tablet TAKE 1 TABLET BY MOUTH TWICE A DAY 180 tablet 1   cetirizine (ZYRTEC) 10 MG tablet Take 10 mg by mouth daily.     Cranberry 1000 MG CAPS Take by mouth.     esomeprazole (NEXIUM) 20 MG capsule Take 20 mg by mouth daily at 12 noon.     folic acid (FOLVITE) 1 MG tablet Take 1 mg by mouth 3 (three) times daily.   3   hydroxychloroquine (PLAQUENIL) 200 MG tablet Take 2 tablets by mouth daily.     levonorgestrel (KYLEENA) 19.5 MG IUD by Intrauterine route once.     losartan (COZAAR) 25 MG tablet TAKE 1 TABLET BY MOUTH EVERY DAY 30 tablet 0   RESTASIS 0.05 % ophthalmic emulsion INSTILL 1 DROP INTO BOTH EYES TWICE A DAY     traZODone (DESYREL) 50 MG tablet TAKE 1/2 TO 1 TABLET BY MOUTH AT BEDTIME FOR SLEEP 90 tablet 0   venlafaxine XR (EFFEXOR-XR) 150 MG 24 hr capsule TAKE 1 CAPSULE BY MOUTH DAILY WITH BREAKFAST. 90 capsule 0   vitamin B-12 (CYANOCOBALAMIN)  100 MCG tablet Take 100 mcg daily by mouth.     Multiple Vitamins-Minerals (MULTIVITAMIN WITH MINERALS) tablet Take 1 tablet by mouth daily. (Patient not taking: No sig reported)     No current facility-administered medications for this visit.    Family History  Problem Relation Age of Onset   Diabetes Father    Hypertension Father    High Cholesterol Father    Diabetes Mother    Hypertension Mother    High Cholesterol Mother    Diabetes Sister    Hypertension Sister    Heart disease Maternal Uncle        MI   Heart disease Maternal Grandmother        MI   Heart disease Maternal Grandfather        MI   Cancer Neg Hx     Review of Systems  All other systems reviewed and are negative.  Exam:   BP 131/79 (BP Location: Right Arm, Patient Position: Sitting, Cuff Size: Large)   Pulse 87   Ht 5\' 2"  (1.575 m)   Wt 191 lb 3.2 oz (86.7 kg)   BMI 34.97 kg/m   Height: 5\' 2"  (157.5 cm)  General appearance: alert,  cooperative and appears stated age Head: Normocephalic, without obvious abnormality, atraumatic Neck: no adenopathy, supple, symmetrical, trachea midline and thyroid enlarged Lungs: clear to auscultation bilaterally Breasts: normal appearance, no masses or tenderness Heart: regular rate and rhythm Abdomen: soft, non-tender; bowel sounds normal; no masses,  no organomegaly Extremities: extremities normal, atraumatic, no cyanosis or edema Skin: Skin color, texture, turgor normal. No rashes or lesions Lymph nodes: Cervical, supraclavicular, and axillary nodes normal. No abnormal inguinal nodes palpated Neurologic: Grossly normal   Pelvic: External genitalia:  no lesions              Urethra:  normal appearing urethra with no masses, tenderness or lesions              Bartholins and Skenes: normal                 Vagina: normal appearing vagina with normal color and no discharge, no lesions              Cervix: no lesions, IUD string not visualized              Pap taken: Yes.   Bimanual Exam:  Uterus:  normal size, contour, position, consistency, mobility, non-tender              Adnexa: normal adnexa and no mass, fullness, tenderness               Rectovaginal: Confirms               Anus:  normal sphincter tone, no lesions  Chaperone, Octaviano Batty, CMA, was present for exam.  Assessment/Plan: 1. Well woman exam with routine gynecological exam - pap and HR HPV obtained today - colon and breast cancer screening guidelines reviewed - has new PCP appt in September  2. Irregular bleeding - US PELVIC COMPLETE WITH TRANSVAGINAL; Future  3. IUD (intrauterine device) in place  4. Thyroid nodule - thyroid ultrasound ordered  5. High risk HPV infection  6. Screening examination for STD (sexually transmitted disease) - RPR+HBsAg+HIV - Hepatitis C antibody

## 2021-06-20 LAB — RPR+HBSAG+HIV
HIV Screen 4th Generation wRfx: NONREACTIVE
Hepatitis B Surface Ag: NEGATIVE
RPR Ser Ql: NONREACTIVE

## 2021-06-20 LAB — HEPATITIS C ANTIBODY: Hep C Virus Ab: 0.2 s/co ratio (ref 0.0–0.9)

## 2021-06-21 ENCOUNTER — Other Ambulatory Visit (HOSPITAL_BASED_OUTPATIENT_CLINIC_OR_DEPARTMENT_OTHER): Payer: Self-pay | Admitting: Obstetrics & Gynecology

## 2021-06-21 ENCOUNTER — Other Ambulatory Visit: Payer: Self-pay | Admitting: Medical

## 2021-06-21 DIAGNOSIS — E041 Nontoxic single thyroid nodule: Secondary | ICD-10-CM

## 2021-06-21 DIAGNOSIS — I10 Essential (primary) hypertension: Secondary | ICD-10-CM

## 2021-06-22 LAB — CYTOLOGY - PAP
Chlamydia: NEGATIVE
Comment: NEGATIVE
Comment: NEGATIVE
Comment: NORMAL
Diagnosis: NEGATIVE
High risk HPV: NEGATIVE
Neisseria Gonorrhea: NEGATIVE

## 2021-06-23 ENCOUNTER — Other Ambulatory Visit: Payer: Self-pay

## 2021-06-23 ENCOUNTER — Ambulatory Visit (HOSPITAL_BASED_OUTPATIENT_CLINIC_OR_DEPARTMENT_OTHER)
Admission: RE | Admit: 2021-06-23 | Discharge: 2021-06-23 | Disposition: A | Discharge status: Home / Self Care | Payer: BC Managed Care – PPO | Source: Ambulatory Visit | Attending: Obstetrics & Gynecology | Admitting: Obstetrics & Gynecology

## 2021-06-23 ENCOUNTER — Telehealth (HOSPITAL_BASED_OUTPATIENT_CLINIC_OR_DEPARTMENT_OTHER): Payer: Self-pay | Admitting: *Deleted

## 2021-06-23 ENCOUNTER — Encounter: Payer: BC Managed Care – PPO | Admitting: Medical

## 2021-06-23 DIAGNOSIS — N939 Abnormal uterine and vaginal bleeding, unspecified: Secondary | ICD-10-CM | POA: Diagnosis not present

## 2021-06-23 DIAGNOSIS — N926 Irregular menstruation, unspecified: Secondary | ICD-10-CM | POA: Diagnosis not present

## 2021-06-23 DIAGNOSIS — E041 Nontoxic single thyroid nodule: Secondary | ICD-10-CM | POA: Insufficient documentation

## 2021-06-23 DIAGNOSIS — Z975 Presence of (intrauterine) contraceptive device: Secondary | ICD-10-CM | POA: Insufficient documentation

## 2021-06-23 DIAGNOSIS — B379 Candidiasis, unspecified: Secondary | ICD-10-CM

## 2021-06-23 MED ORDER — FLUCONAZOLE 150 MG PO TABS: 150.0000 mg | ORAL_TABLET | Freq: Once | ORAL | 0 refills | Status: AC

## 2021-06-23 NOTE — Telephone Encounter (Signed)
DOB verified. Pt calling back in regards to results message. Pt states that she is having itching and discharge consistent with yeast presence on PAP. Advised that Rx will be sent to her pharmacy and to call back if no improvement. Pt verbalized understanding.

## 2021-06-26 ENCOUNTER — Encounter: Payer: Self-pay | Admitting: Family Medicine

## 2021-06-27 ENCOUNTER — Encounter: Payer: BC Managed Care – PPO | Admitting: Medical

## 2021-06-27 ENCOUNTER — Other Ambulatory Visit: Payer: Self-pay

## 2021-06-27 ENCOUNTER — Ambulatory Visit: Payer: BC Managed Care – PPO | Admitting: Medical

## 2021-06-27 ENCOUNTER — Ambulatory Visit
Admission: RE | Admit: 2021-06-27 | Discharge: 2021-06-27 | Disposition: A | Discharge status: Home / Self Care | Payer: BC Managed Care – PPO | Source: Ambulatory Visit | Attending: Medical | Admitting: Medical

## 2021-06-27 VITALS — BP 124/80 | HR 68 | Temp 98.6°F | Wt 194.6 lb

## 2021-06-27 DIAGNOSIS — M79675 Pain in left toe(s): Secondary | ICD-10-CM | POA: Diagnosis not present

## 2021-06-27 DIAGNOSIS — S99922A Unspecified injury of left foot, initial encounter: Secondary | ICD-10-CM

## 2021-06-27 NOTE — Progress Notes (Signed)
Subjective:  Tamara Vega is a 35 y.o. female who presents for Chief Complaint  Patient presents with   other    Toe injury lt. Foot big toe and toe next to big toe, fell walking two dogs hurt her toes and fell on buttocks.      Here for complaint of toe injury.  2 days ago on 06/25/2021 she was walking 2 dogs at the same time holding 2 poop bags.  She dropped a bag of poop stepped on it and slipped.  She ended up plantar flexing the toes up under the foot injuring her left great and second toe.  Since then she had pain and swelling.  She cannot really bend those 2 toes today.  She has used rest and elevation and some ibuprofen.  Otherwise no other complaint or injury.  No other aggravating or relieving factors.    No other c/o.  The following portions of the patient's history were reviewed and updated as appropriate: allergies, current medications, past family history, past medical history, past social history, past surgical history and problem list.  ROS Otherwise as in subjective above  Objective: BP 124/80   Pulse 68   Temp 98.6 F (37 C)   Wt 194 lb 9.6 oz (88.3 kg)   LMP 05/30/2021 (Approximate)   BMI 35.59 kg/m   General appearance: alert, no distress, well developed, well nourished Left second toe dorsum of distal phalanx with small 3 mm x 3 mm abrasion otherwise skin unremarkable, no redness or bruising Left great toe and second toe tender throughout including the MTP and distal metatarsals of the first and second toe.  Limited range of motion of both toes.  Otherwise foot nontender with no swelling or deformity otherwise.  No swelling of the great toe or second toe. Sensation normal. Foot pulses and cap refill normal     Assessment: Encounter Diagnoses  Name Primary?   Injury of great toe of left foot, initial encounter Yes   Toe pain, left      Plan: I will send her for x-rays.  We discussed rest, prevention of future injury, cool water bath in a bucket for cold  therapy 15 minutes twice daily, short-term use of NSAID over-the-counter for pain and inflammation.  We discussed possibly using postop shoe.  I showed her examples of this and they are available online very reasonably priced  We discussed possibly doing buddy taping of the toes  Assuming no fracture, we discussed usual timeframe to see improvement  Alycia was seen today for other.  Diagnoses and all orders for this visit:  Injury of great toe of left foot, initial encounter -     DG Foot Complete Left; Future  Toe pain, left -     DG Foot Complete Left; Future    Follow up: pending xray

## 2021-06-29 ENCOUNTER — Encounter (HOSPITAL_BASED_OUTPATIENT_CLINIC_OR_DEPARTMENT_OTHER): Payer: Self-pay

## 2021-06-29 ENCOUNTER — Other Ambulatory Visit (HOSPITAL_BASED_OUTPATIENT_CLINIC_OR_DEPARTMENT_OTHER): Payer: Self-pay | Admitting: *Deleted

## 2021-06-29 ENCOUNTER — Other Ambulatory Visit (HOSPITAL_BASED_OUTPATIENT_CLINIC_OR_DEPARTMENT_OTHER): Payer: Self-pay | Admitting: Obstetrics & Gynecology

## 2021-06-29 ENCOUNTER — Other Ambulatory Visit: Payer: Self-pay | Admitting: Obstetrics & Gynecology

## 2021-06-29 ENCOUNTER — Other Ambulatory Visit: Payer: Self-pay | Admitting: Medical

## 2021-06-29 DIAGNOSIS — E041 Nontoxic single thyroid nodule: Secondary | ICD-10-CM

## 2021-06-29 MED ORDER — NORETHINDRONE 0.35 MG PO TABS: 1.0000 | ORAL_TABLET | Freq: Every day | ORAL | 2 refills | Status: DC

## 2021-06-29 NOTE — Progress Notes (Signed)
Informed pt that Rx for micronor will be sent to pharmacy to take for irregular bleeding. Advised to take one pill daily. Pt given appt for 3 month follow up.

## 2021-07-07 ENCOUNTER — Other Ambulatory Visit: Payer: Self-pay | Admitting: Medical

## 2021-07-12 DIAGNOSIS — M0579 Rheumatoid arthritis with rheumatoid factor of multiple sites without organ or systems involvement: Secondary | ICD-10-CM | POA: Diagnosis not present

## 2021-07-20 ENCOUNTER — Ambulatory Visit
Admission: RE | Admit: 2021-07-20 | Discharge: 2021-07-20 | Disposition: A | Discharge status: Home / Self Care | Payer: BC Managed Care – PPO | Source: Ambulatory Visit | Attending: Obstetrics & Gynecology | Admitting: Obstetrics & Gynecology

## 2021-07-20 ENCOUNTER — Other Ambulatory Visit: Payer: Self-pay

## 2021-07-20 ENCOUNTER — Other Ambulatory Visit (HOSPITAL_COMMUNITY)
Admission: RE | Admit: 2021-07-20 | Discharge: 2021-07-20 | Disposition: A | Discharge status: Home / Self Care | Payer: BC Managed Care – PPO | Source: Ambulatory Visit | Attending: Radiology | Admitting: Radiology

## 2021-07-20 DIAGNOSIS — E041 Nontoxic single thyroid nodule: Secondary | ICD-10-CM | POA: Insufficient documentation

## 2021-07-21 DIAGNOSIS — F411 Generalized anxiety disorder: Secondary | ICD-10-CM | POA: Diagnosis not present

## 2021-07-22 ENCOUNTER — Other Ambulatory Visit: Payer: Self-pay | Admitting: Medical

## 2021-07-25 LAB — CYTOLOGY - NON PAP

## 2021-07-26 ENCOUNTER — Other Ambulatory Visit (HOSPITAL_BASED_OUTPATIENT_CLINIC_OR_DEPARTMENT_OTHER): Payer: Self-pay | Admitting: Obstetrics & Gynecology

## 2021-07-26 DIAGNOSIS — C73 Malignant neoplasm of thyroid gland: Secondary | ICD-10-CM

## 2021-07-28 DIAGNOSIS — E041 Nontoxic single thyroid nodule: Secondary | ICD-10-CM | POA: Diagnosis not present

## 2021-08-02 ENCOUNTER — Other Ambulatory Visit (HOSPITAL_BASED_OUTPATIENT_CLINIC_OR_DEPARTMENT_OTHER): Payer: Self-pay | Admitting: Obstetrics & Gynecology

## 2021-08-02 ENCOUNTER — Encounter (HOSPITAL_BASED_OUTPATIENT_CLINIC_OR_DEPARTMENT_OTHER): Payer: Self-pay | Admitting: *Deleted

## 2021-08-02 DIAGNOSIS — C73 Malignant neoplasm of thyroid gland: Secondary | ICD-10-CM

## 2021-08-03 ENCOUNTER — Ambulatory Visit: Payer: Self-pay | Admitting: Surgery

## 2021-08-03 DIAGNOSIS — D440 Neoplasm of uncertain behavior of thyroid gland: Secondary | ICD-10-CM | POA: Diagnosis not present

## 2021-08-03 DIAGNOSIS — E042 Nontoxic multinodular goiter: Secondary | ICD-10-CM | POA: Diagnosis not present

## 2021-08-04 ENCOUNTER — Ambulatory Visit (HOSPITAL_BASED_OUTPATIENT_CLINIC_OR_DEPARTMENT_OTHER): Payer: BC Managed Care – PPO | Admitting: Obstetrics & Gynecology

## 2021-08-05 ENCOUNTER — Other Ambulatory Visit: Payer: Self-pay | Admitting: Medical

## 2021-08-08 DIAGNOSIS — F411 Generalized anxiety disorder: Secondary | ICD-10-CM | POA: Diagnosis not present

## 2021-08-09 DIAGNOSIS — M0579 Rheumatoid arthritis with rheumatoid factor of multiple sites without organ or systems involvement: Secondary | ICD-10-CM | POA: Diagnosis not present

## 2021-08-10 ENCOUNTER — Encounter (HOSPITAL_COMMUNITY): Payer: Self-pay

## 2021-08-11 ENCOUNTER — Ambulatory Visit: Payer: BC Managed Care – PPO | Admitting: Physician Assistant

## 2021-08-16 ENCOUNTER — Encounter: Payer: Self-pay | Admitting: Medical

## 2021-08-18 DIAGNOSIS — M069 Rheumatoid arthritis, unspecified: Secondary | ICD-10-CM | POA: Diagnosis not present

## 2021-08-18 DIAGNOSIS — Z79899 Other long term (current) drug therapy: Secondary | ICD-10-CM | POA: Diagnosis not present

## 2021-08-18 DIAGNOSIS — I10 Essential (primary) hypertension: Secondary | ICD-10-CM | POA: Diagnosis not present

## 2021-08-18 DIAGNOSIS — F411 Generalized anxiety disorder: Secondary | ICD-10-CM | POA: Diagnosis not present

## 2021-08-18 DIAGNOSIS — E042 Nontoxic multinodular goiter: Secondary | ICD-10-CM | POA: Diagnosis not present

## 2021-08-20 ENCOUNTER — Other Ambulatory Visit: Payer: Self-pay | Admitting: Medical

## 2021-08-23 DIAGNOSIS — F411 Generalized anxiety disorder: Secondary | ICD-10-CM | POA: Diagnosis not present

## 2021-09-06 DIAGNOSIS — M0579 Rheumatoid arthritis with rheumatoid factor of multiple sites without organ or systems involvement: Secondary | ICD-10-CM | POA: Diagnosis not present

## 2021-09-17 ENCOUNTER — Other Ambulatory Visit: Payer: Self-pay | Admitting: Medical

## 2021-09-17 DIAGNOSIS — I10 Essential (primary) hypertension: Secondary | ICD-10-CM

## 2021-09-18 ENCOUNTER — Other Ambulatory Visit (HOSPITAL_BASED_OUTPATIENT_CLINIC_OR_DEPARTMENT_OTHER): Payer: Self-pay | Admitting: Obstetrics & Gynecology

## 2021-09-20 ENCOUNTER — Other Ambulatory Visit: Payer: Self-pay

## 2021-09-20 ENCOUNTER — Ambulatory Visit (HOSPITAL_BASED_OUTPATIENT_CLINIC_OR_DEPARTMENT_OTHER): Payer: BC Managed Care – PPO | Admitting: Family Medicine

## 2021-09-20 ENCOUNTER — Encounter (HOSPITAL_BASED_OUTPATIENT_CLINIC_OR_DEPARTMENT_OTHER): Payer: Self-pay | Admitting: Family Medicine

## 2021-09-20 VITALS — BP 132/90 | HR 94 | Ht 62.5 in | Wt 199.0 lb

## 2021-09-20 DIAGNOSIS — Z23 Encounter for immunization: Secondary | ICD-10-CM

## 2021-09-20 DIAGNOSIS — E041 Nontoxic single thyroid nodule: Secondary | ICD-10-CM

## 2021-09-20 DIAGNOSIS — I10 Essential (primary) hypertension: Secondary | ICD-10-CM | POA: Diagnosis not present

## 2021-09-20 DIAGNOSIS — Z0000 Encounter for general adult medical examination without abnormal findings: Secondary | ICD-10-CM | POA: Diagnosis not present

## 2021-09-20 DIAGNOSIS — F411 Generalized anxiety disorder: Secondary | ICD-10-CM

## 2021-09-20 MED ORDER — ALPRAZOLAM 0.5 MG PO TABS: ORAL_TABLET | ORAL | 0 refills | Status: AC

## 2021-09-20 NOTE — Progress Notes (Signed)
New Patient Office Visit  Subjective:  Patient ID: Tamara Vega, female    DOB: 1986-04-18  Age: 35 y.o. MRN: [id number]  CC:  Chief Complaint  Patient presents with   Establish Care    Prior PCP - Dr. Glade Lloyd (notes in [place]).   Medication Problem    Patient wants to have a check in to ensure that all her current medication are therapeutic for her. She is overdue for CPE (due in July) and just wants to make sure she is still being managed well. She has a LCSW that worked with her prior PCP for anxiety and depression and triturating medications.     HPI Tamara Vega is a 35 year old female presenting to establish in clinic.  Current concerns as outlined above.  Past medical history significant for hypertension, rheumatoid arthritis, anxiety, insomnia, GERD, thyroid nodule with concern for underlying cancer.  Hypertension: Reports that she was found to have elevated blood pressure during infusion treatments related to her rheumatoid arthritis.  She subsequently was evaluated by her PCP and was started on atenolol and losartan.  Medications were initiated around 2018.  Denies any issues with chest pain, headaches, lightheadedness or dizziness  Rheumatoid arthritis: Follows with [facility] rheumatology for management.  Currently receiving Orencia infusions and also taking hydroxychloroquine.  Anxiety: Has been a chronic issue for patient.  In the past she was initially started on Lexapro but discontinued therapy due to weight gain and lack of improvement of symptoms.  She is currently taking venlafaxine which she has been taking for about 4 to 5 years.  She has also been taking buspirone more recently as well as alprazolam as needed.  Alprazolam is prescribed as needed, but she has been taking this twice daily fairly regularly recently due to increased symptoms related to medical issues, primarily thyroid nodule.  She has scheduled for initial new patient visit with psychiatrist locally.   Currently rates control of symptoms as less than 5 out of 10 with 10 being optimal control.  Also has associated insomnia  GERD: Currently manages with esomeprazole which is taken first thing in the morning.  Thyroid nodule: Was initially found and monitored by Dr. Sabra Heck.  Subsequently, was referred to endocrine surgeon for further evaluation after biopsy had some concern for cancer.  She is currently working with [facility] with thyroidectomy planned for December 1.  Patient follows with Dr. Sabra Heck for women's health needs. She is interested in receiving flu shot today Has not had CPE as of yet this year and is interested in doing some Patient works as a Chief Executive Officer and relates that her job is also a notable source of stress/anxiety for her  Past Medical History:  Diagnosis Date   Allergy    Cancer [facility]) August 2022   Chronic headache    Clotting disorder ([place])    elementary age nose bleeds   Depression    Dysmenorrhea    Family history of diabetes mellitus    Generalized anxiety disorder    GERD (gastroesophageal reflux disease)    High risk medication use    Humira, PPI   Hypertension    Insomnia    Obesity    Rheumatoid arthritis ([place]) 10/2014   started Humira 04/2016; Dr. Gavin Pound   Seasonal allergies     Past Surgical History:  Procedure Laterality Date   ESOPHAGOGASTRODUODENOSCOPY  01/2010   [place], [place]    Family History  Problem Relation Age of Onset   Diabetes Father  Hypertension Father    High Cholesterol Father    Obesity Father    Diabetes Mother    Hypertension Mother    High Cholesterol Mother    Arthritis Mother    Diabetes Sister    Hypertension Sister    Obesity Sister    Heart disease Maternal Uncle        MI   Heart disease Maternal Grandmother        MI   Heart disease Maternal Grandfather        MI   Cancer Neg Hx     Social History   Socioeconomic History   Marital status: Single    Spouse name: Not on file    Number of children: Not on file   Years of education: Not on file   Highest education level: Not on file  Occupational History   Not on file  Tobacco Use   Smoking status: Never   Smokeless tobacco: Never  Vaping Use   Vaping Use: Never used  Substance and Sexual Activity   Alcohol use: Yes    Alcohol/week: 3.0 standard drinks    Types: 2 Glasses of wine, 1 Standard drinks or equivalent per week   Drug use: No   Sexual activity: Not Currently    Birth control/protection: Coitus interruptus, Condom, I.U.D.  Other Topics Concern   Not on file  Social History Narrative   Single, attorney, self employed 2019, was formerly at [employer].   Drinks about 3L water daily.   Started Weight Watchers 11/2018.   [place] 12/2018, 4 times per week, doing facebook live zoom workouts, also doing long walks 6-10 mile walks.  No significant other currently. 06/2020   Social Determinants of Health   Financial Resource Strain: Not on file  Food Insecurity: Not on file  Transportation Needs: Not on file  Physical Activity: Not on file  Stress: Not on file  Social Connections: Not on file  Intimate Partner Violence: Not on file    Objective:   Today's Vitals: BP 132/90   Pulse 94   Ht 5' 2.5" (1.588 m)   Wt 199 lb (90.3 kg)   SpO2 98%   BMI 35.82 kg/m   Physical Exam  35 year old female in no acute distress Cardiovascular exam with regular rate and rhythm, no murmurs appreciated Lungs clear to auscultation bilaterally  Assessment & Plan:   Problem List Items Addressed This Visit       Cardiovascular and Mediastinum   Essential hypertension - Primary    Blood pressure borderline in office today Can continue with current medications including atenolol and losartan Recommend DASH diet Recommend intermittent monitoring at home Consider slight increase of ARB at future visit if blood pressure continues to be borderline or is elevated      Relevant Orders   CBC with  Differential/Platelet   Comprehensive metabolic panel     Endocrine   Thyroid nodule    Planning for total thyroidectomy December 1 with endocrine surgeon Will need to monitor thyroid hormone replacement therapy after surgery      Relevant Orders   CBC with Differential/Platelet   Comprehensive metabolic panel   Lipid panel   Hemoglobin A1c     Other   Generalized anxiety disorder    Not adequately controlled at present Has scheduled to establish with a psychiatrist Dr. Toy Care in January, this was first available appointment for new patient At present, will continue with current regimen of venlafaxine, buspirone,  alprazolam Refilled alprazolam today, PDMP reviewed Continue follow-up with social worker for counseling related to anxiety/depression      Other Visit Diagnoses     Wellness examination       Relevant Orders   CBC with Differential/Platelet   Comprehensive metabolic panel   Lipid panel   Hemoglobin A1c   Encounter for immunization       Relevant Orders   Flu Vaccine QUAD 6+ mos PF IM (Fluarix Quad PF) (Completed)       Outpatient Encounter Medications as of 09/20/2021  Medication Sig   abatacept (ORENCIA) 250 MG injection Inject 250 mg into the vein. Every 4 weeks   ALPRAZolam (XANAX) 0.5 MG tablet TAKE 1 TABLET BY MOUTH TWICE A DAY AS NEEDED FOR ANXIETY OR SLEEP   APPLE CIDER VINEGAR PO Take by mouth.   Ascorbic Acid (VITAMIN C) 100 MG tablet Take 100 mg daily by mouth.   atenolol (TENORMIN) 50 MG tablet TAKE 1 TABLET BY MOUTH EVERY DAY   Biotin 10000 MCG TBDP Take 5,000 mg by mouth.    busPIRone (BUSPAR) 5 MG tablet TAKE 1 TABLET BY MOUTH TWICE A DAY   cetirizine (ZYRTEC) 10 MG tablet Take 10 mg by mouth daily.   Cranberry 1000 MG CAPS Take by mouth.   esomeprazole (NEXIUM) 20 MG capsule Take 20 mg by mouth daily at 12 noon.   folic acid (FOLVITE) 1 MG tablet Take 1 mg by mouth 3 (three) times daily.    hydroxychloroquine (PLAQUENIL) 200 MG tablet Take 2  tablets by mouth daily.   levonorgestrel (KYLEENA) 19.5 MG IUD by Intrauterine route once.   losartan (COZAAR) 25 MG tablet TAKE 1 TABLET BY MOUTH EVERY DAY   norethindrone (MICRONOR) 0.35 MG tablet TAKE 1 TABLET BY MOUTH EVERY DAY   RESTASIS 0.05 % ophthalmic emulsion INSTILL 1 DROP INTO BOTH EYES TWICE A DAY   traZODone (DESYREL) 50 MG tablet TAKE 1/2 TO 1 TABLET BY MOUTH AT BEDTIME FOR SLEEP   venlafaxine XR (EFFEXOR-XR) 150 MG 24 hr capsule TAKE 1 CAPSULE BY MOUTH DAILY WITH BREAKFAST.   vitamin B-12 (CYANOCOBALAMIN) 100 MCG tablet Take 100 mcg daily by mouth.   [DISCONTINUED] Multiple Vitamins-Minerals (MULTIVITAMIN WITH MINERALS) tablet Take 1 tablet by mouth daily. (Patient not taking: No sig reported)   No facility-administered encounter medications on file as of 09/20/2021.    Follow-up: Return in about 2 months (around 11/20/2021).  Plan for CPE at next visit, will review labs at next visit  Kelechi Orgeron J De [place], MD

## 2021-09-20 NOTE — Patient Instructions (Addendum)
  Medication Instructions:  Your physician recommends that you continue on your current medications as directed. Please refer to the Current Medication list given to you today. --If you need a refill on any your medications before your next appointment, please call your pharmacy first. If no refills are authorized on file call the office.-- Lab Work: Your physician has recommended that you have lab work today: CBC, CMP, Lipid Panel, and A1C If you have labs (blood work) drawn today and your tests are completely normal, you will receive your results via [place] a phone call from our staff.  Please ensure you check your voicemail in the event that you authorized detailed messages to be left on a delegated number. If you have any lab test that is abnormal or we need to change your treatment, we will call you to review the results.  Follow-Up: Your next appointment:   Your physician recommends that you schedule a follow-up appointment in: 2 MONTHS for CPE with Dr. de [place]  You will receive a text message or e-mail with a link to a survey about your care and experience with [place] today! We would greatly appreciate your feedback!   Thanks for letting us be apart of your health journey!!  Primary Care and Sports Medicine   Dr. Arlina Robes [place]   We encourage you to activate your patient portal called "MyChart".  Sign up information is provided on this After Visit Summary.  MyChart is used to connect with patients for Virtual Visits (Telemedicine).  Patients are able to view lab/test results, encounter notes, upcoming appointments, etc.  Non-urgent messages can be sent to your provider as well. To learn more about what you can do with MyChart, please visit --  [email or web address].

## 2021-09-20 NOTE — Assessment & Plan Note (Signed)
Not adequately controlled at present Has scheduled to establish with a psychiatrist Dr. Toy Care in January, this was first available appointment for new patient At present, will continue with current regimen of venlafaxine, buspirone, alprazolam Refilled alprazolam today, PDMP reviewed Continue follow-up with social worker for counseling related to anxiety/depression

## 2021-09-20 NOTE — Assessment & Plan Note (Signed)
Blood pressure borderline in office today Can continue with current medications including atenolol and losartan Recommend DASH diet Recommend intermittent monitoring at home Consider slight increase of ARB at future visit if blood pressure continues to be borderline or is elevated

## 2021-09-20 NOTE — Assessment & Plan Note (Signed)
Planning for total thyroidectomy December 1 with endocrine surgeon Will need to monitor thyroid hormone replacement therapy after surgery

## 2021-09-21 LAB — COMPREHENSIVE METABOLIC PANEL
ALT: 25 IU/L (ref 0–32)
AST: 31 IU/L (ref 0–40)
Albumin/Globulin Ratio: 1.4 (ref 1.2–2.2)
Albumin: 4.6 g/dL (ref 3.8–4.8)
Alkaline Phosphatase: 84 IU/L (ref 44–121)
BUN/Creatinine Ratio: 16 (ref 9–23)
BUN: 11 mg/dL (ref 6–20)
Bilirubin Total: 0.3 mg/dL (ref 0.0–1.2)
CO2: 20 mmol/L (ref 20–29)
Calcium: 9.8 mg/dL (ref 8.7–10.2)
Chloride: 102 mmol/L (ref 96–106)
Creatinine, Ser: 0.7 mg/dL (ref 0.57–1.00)
Globulin, Total: 3.2 g/dL (ref 1.5–4.5)
Glucose: 119 mg/dL — ABNORMAL HIGH (ref 70–99)
Potassium: 4 mmol/L (ref 3.5–5.2)
Sodium: 137 mmol/L (ref 134–144)
Total Protein: 7.8 g/dL (ref 6.0–8.5)
eGFR: 115 mL/min/{1.73_m2} (ref >59)

## 2021-09-21 LAB — LIPID PANEL
Chol/HDL Ratio: 2.6 ratio (ref 0.0–4.4)
Cholesterol, Total: 189 mg/dL (ref 100–199)
HDL: 73 mg/dL (ref >39)
LDL Chol Calc (NIH): 105 mg/dL — ABNORMAL HIGH (ref 0–99)
Triglycerides: 58 mg/dL (ref 0–149)
VLDL Cholesterol Cal: 11 mg/dL (ref 5–40)

## 2021-09-21 LAB — CBC WITH DIFFERENTIAL/PLATELET
Basophils Absolute: 0.1 10*3/uL (ref 0.0–0.2)
Basos: 1 %
EOS (ABSOLUTE): 0.8 10*3/uL — ABNORMAL HIGH (ref 0.0–0.4)
Eos: 9 %
Hematocrit: 43.9 % (ref 34.0–46.6)
Hemoglobin: 14.5 g/dL (ref 11.1–15.9)
Immature Grans (Abs): 0 10*3/uL (ref 0.0–0.1)
Immature Granulocytes: 0 %
Lymphocytes Absolute: 3.3 10*3/uL — ABNORMAL HIGH (ref 0.7–3.1)
Lymphs: 38 %
MCH: 28 pg (ref 26.6–33.0)
MCHC: 33 g/dL (ref 31.5–35.7)
MCV: 85 fL (ref 79–97)
Monocytes Absolute: 0.5 10*3/uL (ref 0.1–0.9)
Monocytes: 5 %
Neutrophils Absolute: 4 10*3/uL (ref 1.4–7.0)
Neutrophils: 47 %
Platelets: 399 10*3/uL (ref 150–450)
RBC: 5.17 x10E6/uL (ref 3.77–5.28)
RDW: 10.9 % — ABNORMAL LOW (ref 11.7–15.4)
WBC: 8.5 10*3/uL (ref 3.4–10.8)

## 2021-09-21 LAB — HEMOGLOBIN A1C
Est. average glucose Bld gHb Est-mCnc: 111 mg/dL
Hgb A1c MFr Bld: 5.5 % (ref 4.8–5.6)

## 2021-09-24 ENCOUNTER — Encounter (HOSPITAL_BASED_OUTPATIENT_CLINIC_OR_DEPARTMENT_OTHER): Payer: Self-pay | Admitting: Family Medicine

## 2021-09-25 ENCOUNTER — Other Ambulatory Visit (HOSPITAL_BASED_OUTPATIENT_CLINIC_OR_DEPARTMENT_OTHER): Payer: Self-pay | Admitting: Nurse Practitioner

## 2021-09-25 DIAGNOSIS — J019 Acute sinusitis, unspecified: Secondary | ICD-10-CM

## 2021-09-25 MED ORDER — AMOXICILLIN-POT CLAVULANATE 875-125 MG PO TABS: 1.0000 | ORAL_TABLET | Freq: Two times a day (BID) | ORAL | 0 refills | Status: DC

## 2021-09-27 ENCOUNTER — Other Ambulatory Visit: Payer: Self-pay | Admitting: Medical

## 2021-10-03 ENCOUNTER — Ambulatory Visit (HOSPITAL_BASED_OUTPATIENT_CLINIC_OR_DEPARTMENT_OTHER): Payer: BC Managed Care – PPO | Admitting: Obstetrics & Gynecology

## 2021-10-03 DIAGNOSIS — M2550 Pain in unspecified joint: Secondary | ICD-10-CM | POA: Diagnosis not present

## 2021-10-03 DIAGNOSIS — M0579 Rheumatoid arthritis with rheumatoid factor of multiple sites without organ or systems involvement: Secondary | ICD-10-CM | POA: Diagnosis not present

## 2021-10-03 DIAGNOSIS — Z79899 Other long term (current) drug therapy: Secondary | ICD-10-CM | POA: Diagnosis not present

## 2021-10-04 ENCOUNTER — Ambulatory Visit (HOSPITAL_BASED_OUTPATIENT_CLINIC_OR_DEPARTMENT_OTHER): Payer: BC Managed Care – PPO | Admitting: Obstetrics & Gynecology

## 2021-10-09 DIAGNOSIS — F411 Generalized anxiety disorder: Secondary | ICD-10-CM | POA: Diagnosis not present

## 2021-10-14 ENCOUNTER — Other Ambulatory Visit: Payer: Self-pay | Admitting: Medical

## 2021-10-14 DIAGNOSIS — I10 Essential (primary) hypertension: Secondary | ICD-10-CM

## 2021-10-18 DIAGNOSIS — M0579 Rheumatoid arthritis with rheumatoid factor of multiple sites without organ or systems involvement: Secondary | ICD-10-CM | POA: Diagnosis not present

## 2021-10-24 DIAGNOSIS — E042 Nontoxic multinodular goiter: Secondary | ICD-10-CM | POA: Diagnosis not present

## 2021-10-24 DIAGNOSIS — I10 Essential (primary) hypertension: Secondary | ICD-10-CM | POA: Diagnosis not present

## 2021-10-24 DIAGNOSIS — G4700 Insomnia, unspecified: Secondary | ICD-10-CM | POA: Diagnosis not present

## 2021-10-24 DIAGNOSIS — M069 Rheumatoid arthritis, unspecified: Secondary | ICD-10-CM | POA: Diagnosis not present

## 2021-10-24 DIAGNOSIS — R0683 Snoring: Secondary | ICD-10-CM | POA: Diagnosis not present

## 2021-10-24 DIAGNOSIS — F411 Generalized anxiety disorder: Secondary | ICD-10-CM | POA: Diagnosis not present

## 2021-10-24 DIAGNOSIS — D440 Neoplasm of uncertain behavior of thyroid gland: Secondary | ICD-10-CM | POA: Diagnosis not present

## 2021-10-25 DIAGNOSIS — F411 Generalized anxiety disorder: Secondary | ICD-10-CM | POA: Diagnosis not present

## 2021-10-30 DIAGNOSIS — Z20822 Contact with and (suspected) exposure to covid-19: Secondary | ICD-10-CM | POA: Diagnosis not present

## 2021-11-01 DIAGNOSIS — F411 Generalized anxiety disorder: Secondary | ICD-10-CM | POA: Diagnosis not present

## 2021-11-02 DIAGNOSIS — Z8616 Personal history of COVID-19: Secondary | ICD-10-CM | POA: Diagnosis not present

## 2021-11-02 DIAGNOSIS — F411 Generalized anxiety disorder: Secondary | ICD-10-CM | POA: Diagnosis not present

## 2021-11-02 DIAGNOSIS — E669 Obesity, unspecified: Secondary | ICD-10-CM | POA: Diagnosis not present

## 2021-11-02 DIAGNOSIS — C770 Secondary and unspecified malignant neoplasm of lymph nodes of head, face and neck: Secondary | ICD-10-CM | POA: Diagnosis not present

## 2021-11-02 DIAGNOSIS — F32A Depression, unspecified: Secondary | ICD-10-CM | POA: Diagnosis not present

## 2021-11-02 DIAGNOSIS — Z886 Allergy status to analgesic agent status: Secondary | ICD-10-CM | POA: Diagnosis not present

## 2021-11-02 DIAGNOSIS — G4700 Insomnia, unspecified: Secondary | ICD-10-CM | POA: Diagnosis not present

## 2021-11-02 DIAGNOSIS — C778 Secondary and unspecified malignant neoplasm of lymph nodes of multiple regions: Secondary | ICD-10-CM | POA: Diagnosis not present

## 2021-11-02 DIAGNOSIS — I10 Essential (primary) hypertension: Secondary | ICD-10-CM | POA: Diagnosis not present

## 2021-11-02 DIAGNOSIS — C73 Malignant neoplasm of thyroid gland: Secondary | ICD-10-CM | POA: Diagnosis not present

## 2021-11-02 DIAGNOSIS — Z881 Allergy status to other antibiotic agents status: Secondary | ICD-10-CM | POA: Diagnosis not present

## 2021-11-02 DIAGNOSIS — R0683 Snoring: Secondary | ICD-10-CM | POA: Diagnosis not present

## 2021-11-02 DIAGNOSIS — Z888 Allergy status to other drugs, medicaments and biological substances status: Secondary | ICD-10-CM | POA: Diagnosis not present

## 2021-11-02 DIAGNOSIS — K219 Gastro-esophageal reflux disease without esophagitis: Secondary | ICD-10-CM | POA: Diagnosis not present

## 2021-11-02 DIAGNOSIS — M069 Rheumatoid arthritis, unspecified: Secondary | ICD-10-CM | POA: Diagnosis not present

## 2021-11-02 DIAGNOSIS — Z79899 Other long term (current) drug therapy: Secondary | ICD-10-CM | POA: Diagnosis not present

## 2021-11-02 DIAGNOSIS — E042 Nontoxic multinodular goiter: Secondary | ICD-10-CM | POA: Diagnosis not present

## 2021-11-02 DIAGNOSIS — Z6836 Body mass index (BMI) 36.0-36.9, adult: Secondary | ICD-10-CM | POA: Diagnosis not present

## 2021-11-02 HISTORY — PX: TOTAL THYROIDECTOMY: SHX2547

## 2021-11-03 DIAGNOSIS — Z886 Allergy status to analgesic agent status: Secondary | ICD-10-CM | POA: Diagnosis not present

## 2021-11-03 DIAGNOSIS — M069 Rheumatoid arthritis, unspecified: Secondary | ICD-10-CM | POA: Diagnosis not present

## 2021-11-03 DIAGNOSIS — Z8616 Personal history of COVID-19: Secondary | ICD-10-CM | POA: Diagnosis not present

## 2021-11-03 DIAGNOSIS — Z881 Allergy status to other antibiotic agents status: Secondary | ICD-10-CM | POA: Diagnosis not present

## 2021-11-03 DIAGNOSIS — E669 Obesity, unspecified: Secondary | ICD-10-CM | POA: Diagnosis not present

## 2021-11-03 DIAGNOSIS — K219 Gastro-esophageal reflux disease without esophagitis: Secondary | ICD-10-CM | POA: Diagnosis not present

## 2021-11-03 DIAGNOSIS — Z6836 Body mass index (BMI) 36.0-36.9, adult: Secondary | ICD-10-CM | POA: Diagnosis not present

## 2021-11-03 DIAGNOSIS — I10 Essential (primary) hypertension: Secondary | ICD-10-CM | POA: Diagnosis not present

## 2021-11-03 DIAGNOSIS — C778 Secondary and unspecified malignant neoplasm of lymph nodes of multiple regions: Secondary | ICD-10-CM | POA: Diagnosis not present

## 2021-11-03 DIAGNOSIS — Z888 Allergy status to other drugs, medicaments and biological substances status: Secondary | ICD-10-CM | POA: Diagnosis not present

## 2021-11-03 DIAGNOSIS — C73 Malignant neoplasm of thyroid gland: Secondary | ICD-10-CM | POA: Diagnosis not present

## 2021-11-03 DIAGNOSIS — Z79899 Other long term (current) drug therapy: Secondary | ICD-10-CM | POA: Diagnosis not present

## 2021-11-03 DIAGNOSIS — G4700 Insomnia, unspecified: Secondary | ICD-10-CM | POA: Diagnosis not present

## 2021-11-03 DIAGNOSIS — R0683 Snoring: Secondary | ICD-10-CM | POA: Diagnosis not present

## 2021-11-03 DIAGNOSIS — F32A Depression, unspecified: Secondary | ICD-10-CM | POA: Diagnosis not present

## 2021-11-03 DIAGNOSIS — F411 Generalized anxiety disorder: Secondary | ICD-10-CM | POA: Diagnosis not present

## 2021-11-06 ENCOUNTER — Other Ambulatory Visit: Payer: Self-pay | Admitting: Medical

## 2021-11-17 DIAGNOSIS — C73 Malignant neoplasm of thyroid gland: Secondary | ICD-10-CM | POA: Diagnosis not present

## 2021-11-20 ENCOUNTER — Other Ambulatory Visit: Payer: Self-pay

## 2021-11-20 ENCOUNTER — Encounter (HOSPITAL_BASED_OUTPATIENT_CLINIC_OR_DEPARTMENT_OTHER): Payer: Self-pay | Admitting: Family Medicine

## 2021-11-20 ENCOUNTER — Ambulatory Visit (INDEPENDENT_AMBULATORY_CARE_PROVIDER_SITE_OTHER): Payer: BC Managed Care – PPO | Admitting: Family Medicine

## 2021-11-20 DIAGNOSIS — Z0000 Encounter for general adult medical examination without abnormal findings: Secondary | ICD-10-CM | POA: Diagnosis not present

## 2021-11-20 DIAGNOSIS — I10 Essential (primary) hypertension: Secondary | ICD-10-CM

## 2021-11-20 DIAGNOSIS — M0579 Rheumatoid arthritis with rheumatoid factor of multiple sites without organ or systems involvement: Secondary | ICD-10-CM | POA: Diagnosis not present

## 2021-11-20 MED ORDER — RESTASIS 0.05 % OP EMUL: 1.0000 [drp] | Freq: Two times a day (BID) | OPHTHALMIC | 0 refills | Status: AC

## 2021-11-20 MED ORDER — TRAZODONE HCL 50 MG PO TABS: 50.0000 mg | ORAL_TABLET | Freq: Every day | ORAL | 1 refills | Status: AC

## 2021-11-20 MED ORDER — ATENOLOL 50 MG PO TABS: 50.0000 mg | ORAL_TABLET | Freq: Every day | ORAL | 1 refills | Status: DC

## 2021-11-20 MED ORDER — LOSARTAN POTASSIUM 25 MG PO TABS: 25.0000 mg | ORAL_TABLET | Freq: Every day | ORAL | 1 refills | Status: DC

## 2021-11-20 MED ORDER — BUSPIRONE HCL 5 MG PO TABS: 5.0000 mg | ORAL_TABLET | Freq: Two times a day (BID) | ORAL | 1 refills | Status: DC

## 2021-11-20 MED ORDER — VENLAFAXINE HCL ER 150 MG PO CP24: 150.0000 mg | ORAL_CAPSULE | Freq: Every day | ORAL | 1 refills | Status: DC

## 2021-11-20 NOTE — Patient Instructions (Signed)
°  Medication Instructions:  Your physician recommends that you continue on your current medications as directed. Please refer to the Current Medication list given to you today. --If you need a refill on any your medications before your next appointment, please call your pharmacy first. If no refills are authorized on file call the office.-- Follow-Up: Your next appointment:   Your physician recommends that you schedule a follow-up appointment in: 1 YEAR with Dr. de [place]  You will receive a text message or e-mail with a link to a survey about your care and experience with [place] today! We would greatly appreciate your feedback!   Thanks for letting us be apart of your health journey!!  Primary Care and Sports Medicine   Dr. Arlina Robes [place]   We encourage you to activate your patient portal called "MyChart".  Sign up information is provided on this After Visit Summary.  MyChart is used to connect with patients for Virtual Visits (Telemedicine).  Patients are able to view lab/test results, encounter notes, upcoming appointments, etc.  Non-urgent messages can be sent to your provider as well. To learn more about what you can do with MyChart, please visit --  [email or web address].

## 2021-11-20 NOTE — Assessment & Plan Note (Addendum)
The patient was counseled, risk factors were discussed, anticipatory guidance given. Reviewed labs from 10/19 - overall reassuring Requesting refill on meds - provided today Has had some low back pain - will treat conservatively, offered PT, holding off for now Had recent thyroidectomy, has follow-up with endocrine oncologist scheduled in January, was started on levothyroxine about 2 weeks ago Recommend gradual return to physical activity, does have some low back pain due to recent injury, discussed possibility of working with physical therapy regarding recovery, she wishes to proceed with home exercises on her own and will consider PT if symptoms not improving Discussed general lifestyle modifications/recommendations

## 2021-11-20 NOTE — Progress Notes (Signed)
Subjective:    CC: Annual Physical Exam  HPI:  Tamara Vega is a 35 y.o. 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  She does have follow-up arranged with endocrine oncologist.  I reviewed the past medical history, family history, social history, surgical history, and allergies today and no changes were needed.  Please see the problem list section below in epic for further details.  Past Medical History: Past Medical History:  Diagnosis Date   Allergy    Cancer [facility]) August 2022   Papillary thyroid Cancer   Chronic headache    Clotting disorder [facility])    elementary age nose bleeds   Depression    Dysmenorrhea    Family history of diabetes mellitus    Generalized anxiety disorder    GERD (gastroesophageal reflux disease)    High risk medication use    Humira, PPI   Hypertension    Insomnia    Obesity    Rheumatoid arthritis ([place]) 10/2014   started Humira 04/2016; Dr. Gavin Pound   Seasonal allergies    Past Surgical History: Past Surgical History:  Procedure Laterality Date   ESOPHAGOGASTRODUODENOSCOPY  01/2010   [place], [place]   TOTAL THYROIDECTOMY  11/02/2021   Social History: Social History   Socioeconomic History   Marital status: Single    Spouse name: Not on file   Number of children: Not on file   Years of education: Not on file   Highest education level: Not on file  Occupational History   Not on file  Tobacco Use   Smoking status: Never   Smokeless tobacco: Never  Vaping Use   Vaping Use: Never used  Substance and Sexual Activity   Alcohol use: Yes    Alcohol/week: 3.0 standard drinks    Types: 2 Glasses of wine, 1 Standard drinks or equivalent per week   Drug use: No   Sexual activity: Not Currently    Birth control/protection: Coitus interruptus, Condom, I.U.D.  Other Topics Concern    Not on file  Social History Narrative   Single, attorney, self employed 2019, was formerly at [employer].   Drinks about 3L water daily.   Started Weight Watchers 11/2018.   [place] 12/2018, 4 times per week, doing facebook live zoom workouts, also doing long walks 6-10 mile walks.  No significant other currently. 06/2020   Social Determinants of Health   Financial Resource Strain: Not on file  Food Insecurity: Not on file  Transportation Needs: Not on file  Physical Activity: Not on file  Stress: Not on file  Social Connections: Not on file   Family History: Family History  Problem Relation Age of Onset   Diabetes Father    Hypertension Father    High Cholesterol Father    Obesity Father    Diabetes Mother    Hypertension Mother    High Cholesterol Mother    Arthritis Mother    Diabetes Sister    Hypertension Sister    Obesity Sister    Heart disease Maternal Uncle        MI   Heart disease Maternal Grandmother        MI   Heart disease Maternal Grandfather        MI   Cancer Neg Hx    Allergies: Allergies  Allergen Reactions   Bactrim [Sulfamethoxazole-Trimethoprim] Swelling  reddness and itching    Diclofenac     Blood in stool once 2016   Tdvax [Tetanus-Diphtheria Toxoids Td]     Td vaccine - swollen red painful injection site, fatigue, headache   Medications: See med rec.  Review of Systems: No headache, visual changes, nausea, vomiting, diarrhea, constipation, dizziness, abdominal pain, skin rash, fevers, chills, night sweats, swollen lymph nodes, weight loss, chest pain, body aches, joint swelling, muscle aches, shortness of breath, mood changes, visual or auditory hallucinations.  Objective:    BP 116/76    Pulse 68    Ht 5' 2.5" (1.588 m)    Wt 201 lb (91.2 kg)    SpO2 100%    BMI 36.18 kg/m   General: Well Developed, well nourished, and in no acute distress.  Neuro: Alert and oriented x3, extra-ocular muscles intact, sensation  grossly intact. Cranial nerves II through XII are intact, motor, sensory, and coordinative functions are all intact. HEENT: Normocephalic, atraumatic, pupils equal round reactive to light, neck supple, no masses, no lymphadenopathy.  Well healing incision over anterior throat from recent surgery.  Oropharynx, nasopharynx, external ear canals are unremarkable. Skin: Warm and dry, no rashes noted.  Cardiac: Regular rate and rhythm, no murmurs rubs or gallops.  Respiratory: Clear to auscultation bilaterally. Not using accessory muscles, speaking in full sentences.  Abdominal: Soft, nontender, nondistended, positive bowel sounds, no masses, no organomegaly.  Musculoskeletal: Shoulder, elbow, wrist, hip, knee, ankle stable, and with full range of motion.  Impression and Recommendations:    Wellness examination The patient was counseled, risk factors were discussed, anticipatory guidance given. Reviewed labs from 10/19 - overall reassuring Requesting refill on meds - provided today Has had some low back pain - will treat conservatively, offered PT, holding off for now Had recent thyroidectomy, has follow-up with endocrine oncologist scheduled in January, was started on levothyroxine about 2 weeks ago Recommend gradual return to physical activity, does have some low back pain due to recent injury, discussed possibility of working with physical therapy regarding recovery, she wishes to proceed with home exercises on her own and will consider PT if symptoms not improving Discussed general lifestyle modifications/recommendations   ___________________________________________ Monica Codd de [place], MD, ABFM, CAQSM Primary Care and [place]

## 2021-11-21 ENCOUNTER — Encounter (HOSPITAL_BASED_OUTPATIENT_CLINIC_OR_DEPARTMENT_OTHER): Payer: Self-pay | Admitting: Family Medicine

## 2021-11-22 ENCOUNTER — Encounter (HOSPITAL_BASED_OUTPATIENT_CLINIC_OR_DEPARTMENT_OTHER): Payer: Self-pay | Admitting: Obstetrics & Gynecology

## 2021-11-22 ENCOUNTER — Other Ambulatory Visit: Payer: Self-pay

## 2021-11-22 ENCOUNTER — Ambulatory Visit (HOSPITAL_BASED_OUTPATIENT_CLINIC_OR_DEPARTMENT_OTHER): Payer: BC Managed Care – PPO | Admitting: Obstetrics & Gynecology

## 2021-11-22 VITALS — BP 129/92 | HR 90 | Ht 62.5 in | Wt 201.4 lb

## 2021-11-22 DIAGNOSIS — M62838 Other muscle spasm: Secondary | ICD-10-CM

## 2021-11-22 DIAGNOSIS — E890 Postprocedural hypothyroidism: Secondary | ICD-10-CM

## 2021-11-22 DIAGNOSIS — R7989 Other specified abnormal findings of blood chemistry: Secondary | ICD-10-CM

## 2021-11-23 LAB — COMPREHENSIVE METABOLIC PANEL
ALT: 22 IU/L (ref 0–32)
AST: 21 IU/L (ref 0–40)
Albumin/Globulin Ratio: 1.7 (ref 1.2–2.2)
Albumin: 4.7 g/dL (ref 3.8–4.8)
Alkaline Phosphatase: 62 IU/L (ref 44–121)
BUN/Creatinine Ratio: 12 (ref 9–23)
BUN: 9 mg/dL (ref 6–20)
Bilirubin Total: 0.3 mg/dL (ref 0.0–1.2)
CO2: 24 mmol/L (ref 20–29)
Calcium: 9.6 mg/dL (ref 8.7–10.2)
Chloride: 101 mmol/L (ref 96–106)
Creatinine, Ser: 0.73 mg/dL (ref 0.57–1.00)
Globulin, Total: 2.8 g/dL (ref 1.5–4.5)
Glucose: 92 mg/dL (ref 70–99)
Potassium: 4.4 mmol/L (ref 3.5–5.2)
Sodium: 139 mmol/L (ref 134–144)
Total Protein: 7.5 g/dL (ref 6.0–8.5)
eGFR: 109 mL/min/{1.73_m2} (ref >59)

## 2021-11-23 LAB — MAGNESIUM: Magnesium: 1.9 mg/dL (ref 1.6–2.3)

## 2021-11-23 LAB — PARATHYROID HORMONE, INTACT (NO CA): PTH: 22 pg/mL (ref 15–65)

## 2021-11-23 NOTE — Progress Notes (Signed)
GYNECOLOGY  VISIT  CC:   follow up after having total thyroidectomy  HPI: 35 y.o. G0P0000 Single Asian female here for follow up after having total thyroidectomy with lymph node removal at [facility] on 11/17/2021.  She is healing well.  Lymph nodes were positive so will have to have RAIU therapy.  Feeling a lot of fatigue.  Having muscle cramps that started yesterday.  Did have PTH about 4 hours after surgery and calcium level the next day.  Feels incision is healing well.  Is on synthroid.  Will have levels checked in about a month with endocrinologist at [facility].  GYNECOLOGIC HISTORY: No LMP recorded. (Menstrual status: IUD).   Patient Active Problem List   Diagnosis Date Noted   Wellness examination 11/20/2021   BMI 31.0-31.9,adult 07/19/2020   Screening for lipid disorders 07/19/2020   Screening for diabetes mellitus 06/14/2020   Thyroid nodule 06/14/2020   IUD (intrauterine device) in place 06/14/2020   Elevated LFTs 06/14/2020   Immunization reaction 06/16/2019   Generalized headaches 10/21/2017   Snoring 10/21/2017   Tachycardia 10/21/2017   Essential hypertension 10/21/2017   Family history of sleep apnea 12/06/2016   Obesity 06/08/2016   Family history of diabetes mellitus 06/07/2016   High risk medication use 06/07/2016   Gastroesophageal reflux disease without esophagitis 06/07/2016   Encounter for health maintenance examination in adult 06/07/2016   Rheumatoid arthritis ([place]) 06/07/2016   Work-related stress 11/25/2015   Generalized anxiety disorder 06/15/2015   Insomnia 06/15/2015    Past Medical History:  Diagnosis Date   Allergy    Cancer [facility]) August 2022   Papillary thyroid Cancer   Chronic headache    Clotting disorder ([place])    elementary age nose bleeds   Depression    Dysmenorrhea    Family history of diabetes mellitus    Generalized anxiety disorder    GERD (gastroesophageal reflux disease)    High risk medication use    Humira, PPI   Hypertension     Insomnia    Obesity    Rheumatoid arthritis ([employer]) 10/2014   started Humira 04/2016; Dr. Gavin Pound   Seasonal allergies     Past Surgical History:  Procedure Laterality Date   ESOPHAGOGASTRODUODENOSCOPY  01/2010   [place], [place]   TOTAL THYROIDECTOMY  11/02/2021    MEDS:   Current Outpatient Medications on File Prior to Visit  Medication Sig Dispense Refill   ALPRAZolam (XANAX) 0.5 MG tablet TAKE 1 TABLET BY MOUTH TWICE A DAY AS NEEDED FOR ANXIETY 60 tablet 0   APPLE CIDER VINEGAR PO Take by mouth.     Ascorbic Acid (VITAMIN C) 100 MG tablet Take 100 mg daily by mouth.     atenolol (TENORMIN) 50 MG tablet Take 1 tablet (50 mg total) by mouth daily. 90 tablet 1   Biotin 10000 MCG TBDP Take 5,000 mg by mouth.      busPIRone (BUSPAR) 5 MG tablet Take 1 tablet (5 mg total) by mouth 2 (two) times daily. 60 tablet 1   cetirizine (ZYRTEC) 10 MG tablet Take 10 mg by mouth daily.     Cranberry 1000 MG CAPS Take by mouth.     esomeprazole (NEXIUM) 20 MG capsule Take 20 mg by mouth daily at 12 noon.     hydroxychloroquine (PLAQUENIL) 200 MG tablet Take 2 tablets by mouth daily.     levonorgestrel (KYLEENA) 19.5 MG IUD by Intrauterine route once.     levothyroxine (SYNTHROID) 125 MCG tablet Take 125 mcg  by mouth daily before breakfast.     losartan (COZAAR) 25 MG tablet Take 1 tablet (25 mg total) by mouth daily. 90 tablet 1   RESTASIS 0.05 % ophthalmic emulsion Place 1 drop into both eyes 2 (two) times daily. 1.5 mL 0   Tocilizumab (ACTEMRA) 200 MG/10ML SOLN Inject 1 application into the vein every 28 (twenty-eight) days.     traZODone (DESYREL) 50 MG tablet Take 1 tablet (50 mg total) by mouth at bedtime. 45 tablet 1   venlafaxine XR (EFFEXOR-XR) 150 MG 24 hr capsule Take 1 capsule (150 mg total) by mouth daily with breakfast. 30 capsule 1   vitamin B-12 (CYANOCOBALAMIN) 100 MCG tablet Take 100 mcg daily by mouth.     amoxicillin-clavulanate (AUGMENTIN) 875-125 MG tablet Take 1 tablet by  mouth 2 (two) times daily. (Patient not taking: Reported on 11/22/2021) 10 tablet 0   norethindrone (MICRONOR) 0.35 MG tablet TAKE 1 TABLET BY MOUTH EVERY DAY (Patient not taking: Reported on 11/22/2021) 84 tablet 0   No current facility-administered medications on file prior to visit.    ALLERGIES: Bactrim [sulfamethoxazole-trimethoprim], Diclofenac, and Tdvax [tetanus-diphtheria toxoids td]  Family History  Problem Relation Age of Onset   Diabetes Father    Hypertension Father    High Cholesterol Father    Obesity Father    Diabetes Mother    Hypertension Mother    High Cholesterol Mother    Arthritis Mother    Diabetes Sister    Hypertension Sister    Obesity Sister    Heart disease Maternal Uncle        MI   Heart disease Maternal Grandmother        MI   Heart disease Maternal Grandfather        MI   Cancer Neg Hx     SH:  single, non smoker  Review of Systems  Constitutional:  Positive for malaise/fatigue.       Muscles cramping   PHYSICAL EXAMINATION:    BP (!) 129/92 (BP Location: Left Arm, Patient Position: Sitting, Cuff Size: Large)    Pulse 90    Ht 5' 2.5" (1.588 m)    Wt 201 lb 6.4 oz (91.4 kg)    BMI 36.25 kg/m     General appearance: alert, cooperative and appears stated age Neck: incision is healing well Mood: normal  Assessment/Plan: 1. Muscle spasm - Comprehensive metabolic panel - Magnesium - PTH, intact (no Ca)  2. Low serum parathyroid hormone (PTH)  3. H/O total thyroidectomy

## 2021-11-30 ENCOUNTER — Encounter (HOSPITAL_BASED_OUTPATIENT_CLINIC_OR_DEPARTMENT_OTHER): Payer: Self-pay | Admitting: Obstetrics & Gynecology

## 2021-12-08 DIAGNOSIS — E890 Postprocedural hypothyroidism: Secondary | ICD-10-CM | POA: Diagnosis not present

## 2021-12-08 DIAGNOSIS — C73 Malignant neoplasm of thyroid gland: Secondary | ICD-10-CM | POA: Diagnosis not present

## 2021-12-11 ENCOUNTER — Other Ambulatory Visit (HOSPITAL_BASED_OUTPATIENT_CLINIC_OR_DEPARTMENT_OTHER): Payer: Self-pay | Admitting: Obstetrics & Gynecology

## 2021-12-14 DIAGNOSIS — F411 Generalized anxiety disorder: Secondary | ICD-10-CM | POA: Diagnosis not present

## 2021-12-15 DIAGNOSIS — H5210 Myopia, unspecified eye: Secondary | ICD-10-CM | POA: Diagnosis not present

## 2021-12-15 DIAGNOSIS — Z79899 Other long term (current) drug therapy: Secondary | ICD-10-CM | POA: Diagnosis not present

## 2021-12-19 DIAGNOSIS — F411 Generalized anxiety disorder: Secondary | ICD-10-CM | POA: Diagnosis not present

## 2021-12-26 DIAGNOSIS — H5213 Myopia, bilateral: Secondary | ICD-10-CM | POA: Diagnosis not present

## 2021-12-27 DIAGNOSIS — M0579 Rheumatoid arthritis with rheumatoid factor of multiple sites without organ or systems involvement: Secondary | ICD-10-CM | POA: Diagnosis not present

## 2021-12-27 DIAGNOSIS — Z111 Encounter for screening for respiratory tuberculosis: Secondary | ICD-10-CM | POA: Diagnosis not present

## 2021-12-27 DIAGNOSIS — R5383 Other fatigue: Secondary | ICD-10-CM | POA: Diagnosis not present

## 2021-12-28 DIAGNOSIS — F411 Generalized anxiety disorder: Secondary | ICD-10-CM | POA: Diagnosis not present

## 2022-01-02 ENCOUNTER — Encounter (HOSPITAL_BASED_OUTPATIENT_CLINIC_OR_DEPARTMENT_OTHER): Payer: Self-pay | Admitting: Obstetrics & Gynecology

## 2022-01-02 ENCOUNTER — Other Ambulatory Visit (HOSPITAL_BASED_OUTPATIENT_CLINIC_OR_DEPARTMENT_OTHER): Payer: Self-pay | Admitting: Obstetrics & Gynecology

## 2022-01-02 MED ORDER — LEVOTHYROXINE SODIUM 125 MCG PO TABS: 125.0000 ug | ORAL_TABLET | Freq: Every day | ORAL | 0 refills | Status: DC

## 2022-01-09 DIAGNOSIS — R5383 Other fatigue: Secondary | ICD-10-CM | POA: Diagnosis not present

## 2022-01-09 DIAGNOSIS — M791 Myalgia, unspecified site: Secondary | ICD-10-CM | POA: Diagnosis not present

## 2022-01-09 DIAGNOSIS — M0579 Rheumatoid arthritis with rheumatoid factor of multiple sites without organ or systems involvement: Secondary | ICD-10-CM | POA: Diagnosis not present

## 2022-01-09 DIAGNOSIS — M2550 Pain in unspecified joint: Secondary | ICD-10-CM | POA: Diagnosis not present

## 2022-01-11 DIAGNOSIS — F411 Generalized anxiety disorder: Secondary | ICD-10-CM | POA: Diagnosis not present

## 2022-01-18 DIAGNOSIS — F411 Generalized anxiety disorder: Secondary | ICD-10-CM | POA: Diagnosis not present

## 2022-01-25 DIAGNOSIS — F411 Generalized anxiety disorder: Secondary | ICD-10-CM | POA: Diagnosis not present

## 2022-01-29 DIAGNOSIS — F410 Panic disorder [episodic paroxysmal anxiety] without agoraphobia: Secondary | ICD-10-CM | POA: Diagnosis not present

## 2022-01-29 DIAGNOSIS — F411 Generalized anxiety disorder: Secondary | ICD-10-CM | POA: Diagnosis not present

## 2022-01-29 DIAGNOSIS — M0579 Rheumatoid arthritis with rheumatoid factor of multiple sites without organ or systems involvement: Secondary | ICD-10-CM | POA: Diagnosis not present

## 2022-02-08 DIAGNOSIS — F411 Generalized anxiety disorder: Secondary | ICD-10-CM | POA: Diagnosis not present

## 2022-02-10 ENCOUNTER — Other Ambulatory Visit: Payer: Self-pay | Admitting: Medical

## 2022-02-14 DIAGNOSIS — F411 Generalized anxiety disorder: Secondary | ICD-10-CM | POA: Diagnosis not present

## 2022-02-19 DIAGNOSIS — C73 Malignant neoplasm of thyroid gland: Secondary | ICD-10-CM | POA: Diagnosis not present

## 2022-02-20 DIAGNOSIS — C73 Malignant neoplasm of thyroid gland: Secondary | ICD-10-CM | POA: Diagnosis not present

## 2022-02-21 DIAGNOSIS — C73 Malignant neoplasm of thyroid gland: Secondary | ICD-10-CM | POA: Diagnosis not present

## 2022-02-23 DIAGNOSIS — C73 Malignant neoplasm of thyroid gland: Secondary | ICD-10-CM | POA: Diagnosis not present

## 2022-02-27 DIAGNOSIS — C73 Malignant neoplasm of thyroid gland: Secondary | ICD-10-CM | POA: Diagnosis not present

## 2022-03-06 DIAGNOSIS — M0579 Rheumatoid arthritis with rheumatoid factor of multiple sites without organ or systems involvement: Secondary | ICD-10-CM | POA: Diagnosis not present

## 2022-03-30 DIAGNOSIS — C73 Malignant neoplasm of thyroid gland: Secondary | ICD-10-CM | POA: Diagnosis not present

## 2022-03-30 DIAGNOSIS — E890 Postprocedural hypothyroidism: Secondary | ICD-10-CM | POA: Diagnosis not present

## 2022-04-03 DIAGNOSIS — Z79899 Other long term (current) drug therapy: Secondary | ICD-10-CM | POA: Diagnosis not present

## 2022-04-03 DIAGNOSIS — M0579 Rheumatoid arthritis with rheumatoid factor of multiple sites without organ or systems involvement: Secondary | ICD-10-CM | POA: Diagnosis not present

## 2022-05-09 DIAGNOSIS — R7989 Other specified abnormal findings of blood chemistry: Secondary | ICD-10-CM | POA: Diagnosis not present

## 2022-05-09 DIAGNOSIS — M0579 Rheumatoid arthritis with rheumatoid factor of multiple sites without organ or systems involvement: Secondary | ICD-10-CM | POA: Diagnosis not present

## 2022-05-10 ENCOUNTER — Other Ambulatory Visit: Payer: Self-pay | Admitting: Rheumatology

## 2022-05-10 DIAGNOSIS — R7989 Other specified abnormal findings of blood chemistry: Secondary | ICD-10-CM

## 2022-05-12 ENCOUNTER — Other Ambulatory Visit (HOSPITAL_BASED_OUTPATIENT_CLINIC_OR_DEPARTMENT_OTHER): Payer: Self-pay | Admitting: Family Medicine

## 2022-05-12 DIAGNOSIS — I10 Essential (primary) hypertension: Secondary | ICD-10-CM

## 2022-05-16 ENCOUNTER — Ambulatory Visit
Admission: RE | Admit: 2022-05-16 | Discharge: 2022-05-16 | Disposition: A | Discharge status: Home / Self Care | Payer: BC Managed Care – PPO | Source: Ambulatory Visit | Attending: Rheumatology | Admitting: Rheumatology

## 2022-05-16 ENCOUNTER — Other Ambulatory Visit: Payer: BC Managed Care – PPO

## 2022-05-16 DIAGNOSIS — R7989 Other specified abnormal findings of blood chemistry: Secondary | ICD-10-CM

## 2022-05-21 DIAGNOSIS — F410 Panic disorder [episodic paroxysmal anxiety] without agoraphobia: Secondary | ICD-10-CM | POA: Diagnosis not present

## 2022-05-21 DIAGNOSIS — F411 Generalized anxiety disorder: Secondary | ICD-10-CM | POA: Diagnosis not present

## 2022-06-11 DIAGNOSIS — M0579 Rheumatoid arthritis with rheumatoid factor of multiple sites without organ or systems involvement: Secondary | ICD-10-CM | POA: Diagnosis not present

## 2022-06-21 ENCOUNTER — Ambulatory Visit (HOSPITAL_BASED_OUTPATIENT_CLINIC_OR_DEPARTMENT_OTHER): Payer: BC Managed Care – PPO | Admitting: Obstetrics & Gynecology

## 2022-07-11 DIAGNOSIS — M0579 Rheumatoid arthritis with rheumatoid factor of multiple sites without organ or systems involvement: Secondary | ICD-10-CM | POA: Diagnosis not present

## 2022-07-11 DIAGNOSIS — Z79899 Other long term (current) drug therapy: Secondary | ICD-10-CM | POA: Diagnosis not present

## 2022-07-24 DIAGNOSIS — F410 Panic disorder [episodic paroxysmal anxiety] without agoraphobia: Secondary | ICD-10-CM | POA: Diagnosis not present

## 2022-07-24 DIAGNOSIS — F411 Generalized anxiety disorder: Secondary | ICD-10-CM | POA: Diagnosis not present

## 2022-07-30 DIAGNOSIS — L299 Pruritus, unspecified: Secondary | ICD-10-CM | POA: Diagnosis not present

## 2022-07-30 DIAGNOSIS — L309 Dermatitis, unspecified: Secondary | ICD-10-CM | POA: Diagnosis not present

## 2022-08-02 DIAGNOSIS — G5602 Carpal tunnel syndrome, left upper limb: Secondary | ICD-10-CM | POA: Diagnosis not present

## 2022-08-02 DIAGNOSIS — K760 Fatty (change of) liver, not elsewhere classified: Secondary | ICD-10-CM | POA: Diagnosis not present

## 2022-08-02 DIAGNOSIS — Z79899 Other long term (current) drug therapy: Secondary | ICD-10-CM | POA: Diagnosis not present

## 2022-08-02 DIAGNOSIS — M0579 Rheumatoid arthritis with rheumatoid factor of multiple sites without organ or systems involvement: Secondary | ICD-10-CM | POA: Diagnosis not present

## 2022-08-13 DIAGNOSIS — L309 Dermatitis, unspecified: Secondary | ICD-10-CM | POA: Diagnosis not present

## 2022-08-13 DIAGNOSIS — L731 Pseudofolliculitis barbae: Secondary | ICD-10-CM | POA: Diagnosis not present

## 2022-08-13 DIAGNOSIS — L299 Pruritus, unspecified: Secondary | ICD-10-CM | POA: Diagnosis not present

## 2022-08-16 DIAGNOSIS — M0579 Rheumatoid arthritis with rheumatoid factor of multiple sites without organ or systems involvement: Secondary | ICD-10-CM | POA: Diagnosis not present

## 2022-08-17 DIAGNOSIS — C73 Malignant neoplasm of thyroid gland: Secondary | ICD-10-CM | POA: Diagnosis not present

## 2022-08-17 DIAGNOSIS — Z9089 Acquired absence of other organs: Secondary | ICD-10-CM | POA: Diagnosis not present

## 2022-08-17 DIAGNOSIS — E890 Postprocedural hypothyroidism: Secondary | ICD-10-CM | POA: Diagnosis not present

## 2022-08-28 DIAGNOSIS — L309 Dermatitis, unspecified: Secondary | ICD-10-CM | POA: Diagnosis not present

## 2022-08-28 DIAGNOSIS — L299 Pruritus, unspecified: Secondary | ICD-10-CM | POA: Diagnosis not present

## 2022-08-28 DIAGNOSIS — L731 Pseudofolliculitis barbae: Secondary | ICD-10-CM | POA: Diagnosis not present

## 2022-08-28 DIAGNOSIS — L818 Other specified disorders of pigmentation: Secondary | ICD-10-CM | POA: Diagnosis not present

## 2022-09-03 ENCOUNTER — Encounter (HOSPITAL_BASED_OUTPATIENT_CLINIC_OR_DEPARTMENT_OTHER): Payer: Self-pay | Admitting: Obstetrics & Gynecology

## 2022-09-03 ENCOUNTER — Ambulatory Visit (INDEPENDENT_AMBULATORY_CARE_PROVIDER_SITE_OTHER): Payer: BC Managed Care – PPO | Admitting: Obstetrics & Gynecology

## 2022-09-03 ENCOUNTER — Other Ambulatory Visit (HOSPITAL_COMMUNITY)
Admission: RE | Admit: 2022-09-03 | Discharge: 2022-09-03 | Disposition: A | Discharge status: Home / Self Care | Payer: BC Managed Care – PPO | Source: Ambulatory Visit | Attending: Obstetrics & Gynecology | Admitting: Obstetrics & Gynecology

## 2022-09-03 VITALS — BP 120/75 | HR 72 | Ht 62.5 in | Wt 220.2 lb

## 2022-09-03 DIAGNOSIS — Z8742 Personal history of other diseases of the female genital tract: Secondary | ICD-10-CM | POA: Diagnosis not present

## 2022-09-03 DIAGNOSIS — Z975 Presence of (intrauterine) contraceptive device: Secondary | ICD-10-CM | POA: Diagnosis not present

## 2022-09-03 DIAGNOSIS — E669 Obesity, unspecified: Secondary | ICD-10-CM | POA: Diagnosis not present

## 2022-09-03 DIAGNOSIS — Z01419 Encounter for gynecological examination (general) (routine) without abnormal findings: Secondary | ICD-10-CM | POA: Diagnosis not present

## 2022-09-03 DIAGNOSIS — Z131 Encounter for screening for diabetes mellitus: Secondary | ICD-10-CM | POA: Diagnosis not present

## 2022-09-03 DIAGNOSIS — Z0000 Encounter for general adult medical examination without abnormal findings: Secondary | ICD-10-CM | POA: Diagnosis not present

## 2022-09-03 DIAGNOSIS — Z124 Encounter for screening for malignant neoplasm of cervix: Secondary | ICD-10-CM

## 2022-09-03 DIAGNOSIS — R748 Abnormal levels of other serum enzymes: Secondary | ICD-10-CM

## 2022-09-03 DIAGNOSIS — E7841 Elevated Lipoprotein(a): Secondary | ICD-10-CM | POA: Diagnosis not present

## 2022-09-03 DIAGNOSIS — B977 Papillomavirus as the cause of diseases classified elsewhere: Secondary | ICD-10-CM

## 2022-09-03 DIAGNOSIS — Z833 Family history of diabetes mellitus: Secondary | ICD-10-CM | POA: Diagnosis not present

## 2022-09-03 NOTE — Progress Notes (Signed)
36 y.o. G0P0000 Single Asian female here for annual exam.   Doing well from thyroid cancer standpoint.  Has probable small area of thyroid tissue remaining.  Followed at [facility].  Follow up will be in 6 months.    Has IUD.  Has very little bleeding now.  Last year was more irregular.  Has [place] IUD placed 09/2019.  Aware of 5 year follow up.    Seeing Dr. Toy Care as well.  She is now on Trintellix.    No LMP recorded. (Menstrual status: IUD).          Sexually active: No.  The current method of family planning is IUD.    Smoker:  no  Health Maintenance: Pap:  will obtain today History of abnormal Pap:  h/o high risk hpv MMG:  guidelines reviewed Colonoscopy:  guidelines reviewed Screening Labs: drawn today   reports that she has never smoked. She has never used smokeless tobacco. She reports current alcohol use of about 3.0 standard drinks of alcohol per week. She reports that she does not use drugs.  Past Medical History:  Diagnosis Date   Allergy    Cancer [facility]) August 2022   Papillary thyroid Cancer   Chronic headache    Clotting disorder [facility])    elementary age nose bleeds   Depression    Dysmenorrhea    Family history of diabetes mellitus    Generalized anxiety disorder    GERD (gastroesophageal reflux disease)    High risk medication use    Humira, PPI   Hypertension    Insomnia    Obesity    Rheumatoid arthritis ([place]) 10/2014   started Humira 04/2016; Dr. Gavin Pound   Seasonal allergies     Past Surgical History:  Procedure Laterality Date   ESOPHAGOGASTRODUODENOSCOPY  01/2010   [place], [place]   TOTAL THYROIDECTOMY  11/02/2021    Current Outpatient Medications  Medication Sig Dispense Refill   ALPRAZolam (XANAX) 0.5 MG tablet TAKE 1 TABLET BY MOUTH TWICE A DAY AS NEEDED FOR ANXIETY 60 tablet 0   APPLE CIDER VINEGAR PO Take by mouth.     Ascorbic Acid (VITAMIN C) 100 MG tablet Take 100 mg daily by mouth.     atenolol (TENORMIN) 50 MG tablet TAKE 1 TABLET BY  MOUTH EVERY DAY 90 tablet 1   Biotin 10000 MCG TBDP Take 5,000 mg by mouth.      Cranberry 1000 MG CAPS Take by mouth.     hydroxychloroquine (PLAQUENIL) 200 MG tablet Take 2 tablets by mouth daily.     levonorgestrel (KYLEENA) 19.5 MG IUD by Intrauterine route once.     levothyroxine (SYNTHROID) 125 MCG tablet Take 1 tablet (125 mcg total) by mouth daily before breakfast. 30 tablet 0   losartan (COZAAR) 25 MG tablet TAKE 1 TABLET (25 MG TOTAL) BY MOUTH DAILY. 90 tablet 1   RESTASIS 0.05 % ophthalmic emulsion Place 1 drop into both eyes 2 (two) times daily. 1.5 mL 0   Tocilizumab (ACTEMRA) 200 MG/10ML SOLN Inject 1 application into the vein every 28 (twenty-eight) days.     traZODone (DESYREL) 50 MG tablet Take 1 tablet (50 mg total) by mouth at bedtime. 45 tablet 1   vitamin B-12 (CYANOCOBALAMIN) 100 MCG tablet Take 100 mcg daily by mouth.     No current facility-administered medications for this visit.    Family History  Problem Relation Age of Onset   Diabetes Father    Hypertension Father    High Cholesterol  Father    Obesity Father    Diabetes Mother    Hypertension Mother    High Cholesterol Mother    Arthritis Mother    Diabetes Sister    Hypertension Sister    Obesity Sister    Heart disease Maternal Uncle        MI   Heart disease Maternal Grandmother        MI   Heart disease Maternal Grandfather        MI   Cancer Neg Hx     ROS: Constitutional: negative Genitourinary:negative  Exam:   BP 120/75 (BP Location: Left Arm, Patient Position: Sitting, Cuff Size: Large)   Pulse 72   Ht 5' 2.5" (1.588 m) Comment: reported  Wt 220 lb 3.2 oz (99.9 kg)   BMI 39.63 kg/m   Height: 5' 2.5" (158.8 cm) (reported)  General appearance: alert, cooperative and appears stated age Head: Normocephalic, without obvious abnormality, atraumatic Neck: no adenopathy, supple, symmetrical, trachea midline and thyroid normal to inspection and palpation Lungs: clear to auscultation  bilaterally Breasts: normal appearance, no masses or tenderness Heart: regular rate and rhythm Abdomen: soft, non-tender; bowel sounds normal; no masses,  no organomegaly Extremities: extremities normal, atraumatic, no cyanosis or edema Skin: Skin color, texture, turgor normal. No rashes or lesions Lymph nodes: Cervical, supraclavicular, and axillary nodes normal. No abnormal inguinal nodes palpated Neurologic: Grossly normal   Pelvic: External genitalia:  no lesions              Urethra:  normal appearing urethra with no masses, tenderness or lesions              Bartholins and Skenes: normal                 Vagina: normal appearing vagina with normal color and no discharge, no lesions              Cervix: no lesions, IUD string noted              Pap taken: Yes.   Bimanual Exam:  Uterus:  normal size, contour, position, consistency, mobility, non-tender              Adnexa: normal adnexa and no mass, fullness, tenderness               Rectovaginal: Confirms               Anus:  normal sphincter tone, no lesions  Chaperone, Octaviano Batty, CMA, was present for exam.  Assessment/Plan: 1. Well woman exam with routine gynecological exam - Pap smear with HR HPV obtained today - Mammogram guidelines reviewed - Colonoscopy guidelines reviewed - lab work ordered - vaccines reviewed/updated  2. High risk HPV infection  3. Cervical cancer screening - Cytology - PAP( [place])  4. Blood tests for routine general physical examination - CBC - Comprehensive metabolic panel - Hemoglobin A1c - Lipid panel

## 2022-09-04 LAB — CBC
Hematocrit: 47.3 % — ABNORMAL HIGH (ref 34.0–46.6)
Hemoglobin: 15.3 g/dL (ref 11.1–15.9)
MCH: 28.3 pg (ref 26.6–33.0)
MCHC: 32.3 g/dL (ref 31.5–35.7)
MCV: 87 fL (ref 79–97)
Platelets: 348 10*3/uL (ref 150–450)
RBC: 5.41 x10E6/uL — ABNORMAL HIGH (ref 3.77–5.28)
RDW: 12.4 % (ref 11.7–15.4)
WBC: 8.4 10*3/uL (ref 3.4–10.8)

## 2022-09-04 LAB — HEMOGLOBIN A1C
Est. average glucose Bld gHb Est-mCnc: 114 mg/dL
Hgb A1c MFr Bld: 5.6 % (ref 4.8–5.6)

## 2022-09-04 LAB — LIPID PANEL
Chol/HDL Ratio: 2.5 ratio (ref 0.0–4.4)
Cholesterol, Total: 229 mg/dL — ABNORMAL HIGH (ref 100–199)
HDL: 90 mg/dL (ref >39)
LDL Chol Calc (NIH): 117 mg/dL — ABNORMAL HIGH (ref 0–99)
Triglycerides: 129 mg/dL (ref 0–149)
VLDL Cholesterol Cal: 22 mg/dL (ref 5–40)

## 2022-09-04 LAB — COMPREHENSIVE METABOLIC PANEL
ALT: 39 IU/L — ABNORMAL HIGH (ref 0–32)
AST: 35 IU/L (ref 0–40)
Albumin/Globulin Ratio: 1.6 (ref 1.2–2.2)
Albumin: 4.8 g/dL (ref 3.9–4.9)
Alkaline Phosphatase: 60 IU/L (ref 44–121)
BUN/Creatinine Ratio: 11 (ref 9–23)
BUN: 8 mg/dL (ref 6–20)
Bilirubin Total: 0.4 mg/dL (ref 0.0–1.2)
CO2: 22 mmol/L (ref 20–29)
Calcium: 10.2 mg/dL (ref 8.7–10.2)
Chloride: 101 mmol/L (ref 96–106)
Creatinine, Ser: 0.75 mg/dL (ref 0.57–1.00)
Globulin, Total: 3 g/dL (ref 1.5–4.5)
Glucose: 85 mg/dL (ref 70–99)
Potassium: 4.6 mmol/L (ref 3.5–5.2)
Sodium: 139 mmol/L (ref 134–144)
Total Protein: 7.8 g/dL (ref 6.0–8.5)
eGFR: 105 mL/min/{1.73_m2} (ref >59)

## 2022-09-06 LAB — CYTOLOGY - PAP
Comment: NEGATIVE
Diagnosis: NEGATIVE
High risk HPV: NEGATIVE

## 2022-09-12 ENCOUNTER — Other Ambulatory Visit (HOSPITAL_BASED_OUTPATIENT_CLINIC_OR_DEPARTMENT_OTHER): Payer: Self-pay

## 2022-09-12 DIAGNOSIS — I10 Essential (primary) hypertension: Secondary | ICD-10-CM

## 2022-09-12 MED ORDER — ATENOLOL 50 MG PO TABS: 50.0000 mg | ORAL_TABLET | Freq: Every day | ORAL | 1 refills | Status: DC

## 2022-09-12 MED ORDER — LOSARTAN POTASSIUM 25 MG PO TABS: 25.0000 mg | ORAL_TABLET | Freq: Every day | ORAL | 1 refills | Status: DC

## 2022-09-13 NOTE — Addendum Note (Signed)
Addended by: Megan Salon on: 09/13/2022 06:16 AM   Modules accepted: Orders

## 2022-09-14 DIAGNOSIS — M0579 Rheumatoid arthritis with rheumatoid factor of multiple sites without organ or systems involvement: Secondary | ICD-10-CM | POA: Diagnosis not present

## 2022-09-16 IMAGING — US US THYROID
1 series · 13 of 25 positions shown · non-contrast
Comparison: 09/29/2019

CLINICAL DATA: Prior ultrasound follow-up. Multinodular thyroid
goiter with previously identified right inferior thyroid nodule
meeting criteria for 1 year follow-up ultrasound. Other solid and
cystic nodules in both lobes do not meet criteria for biopsy or
dedicated follow-up.

EXAM:
THYROID ULTRASOUND
TECHNIQUE: Ultrasound examination of the thyroid gland and adjacent soft
tissues was performed.

[Series 1: us thyroid · 102 acquisitions, 13 frames shown]
[im 1/102]
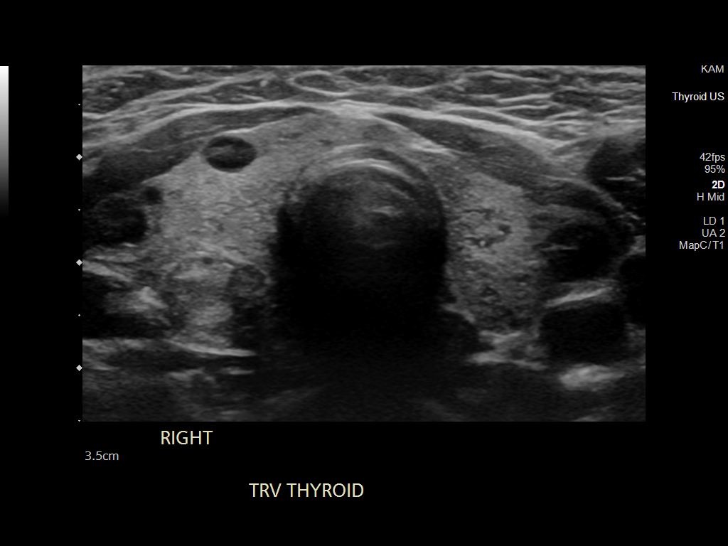
[im 9/102]
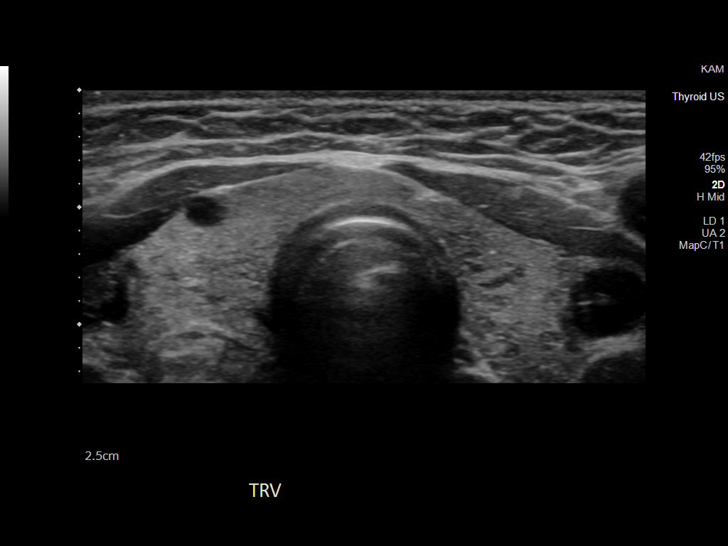
[im 17/102]
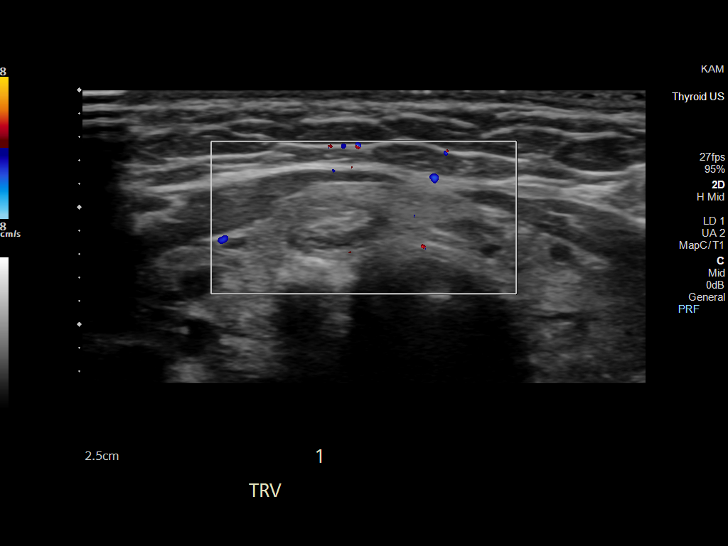
[im 26/102]
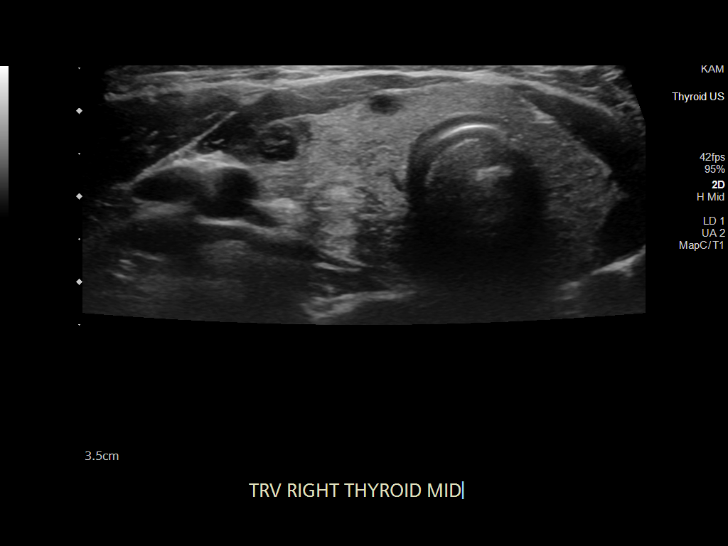
[im 34/102]
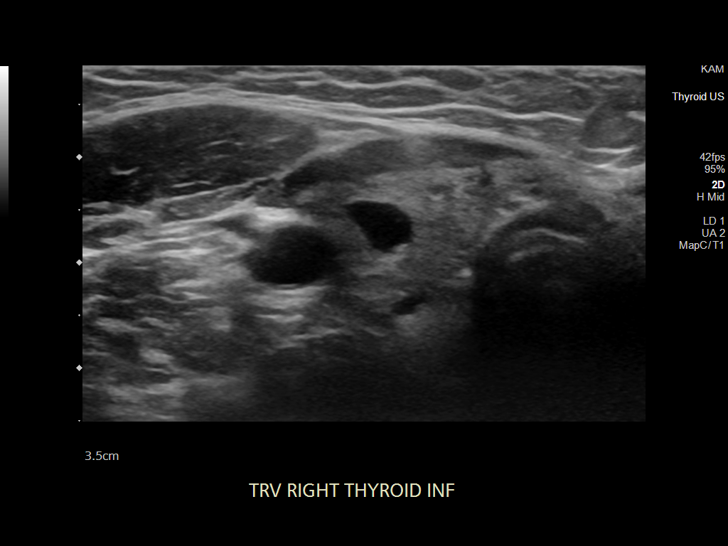
[im 43/102]
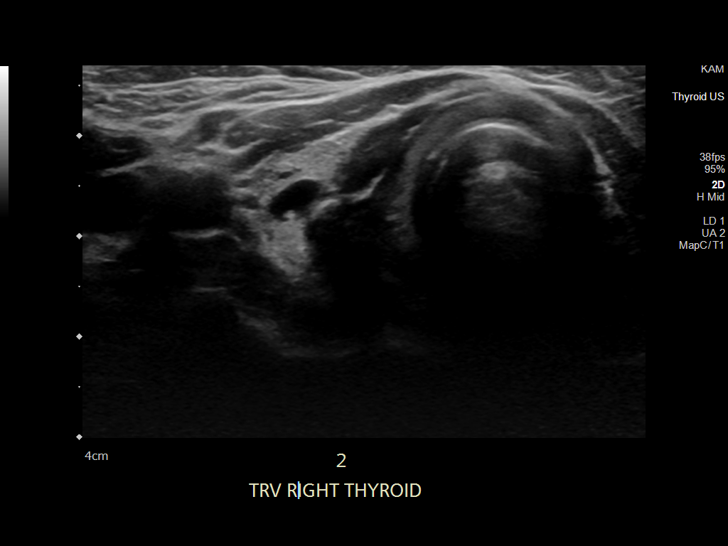
[im 51/102]
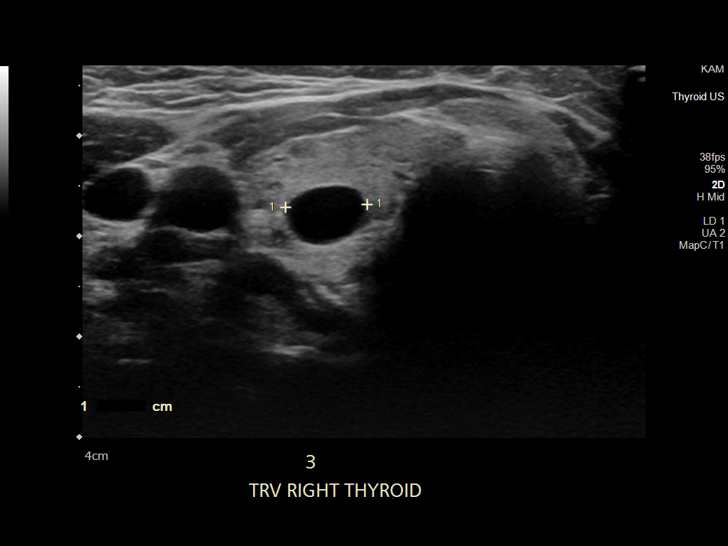
[im 59/102]
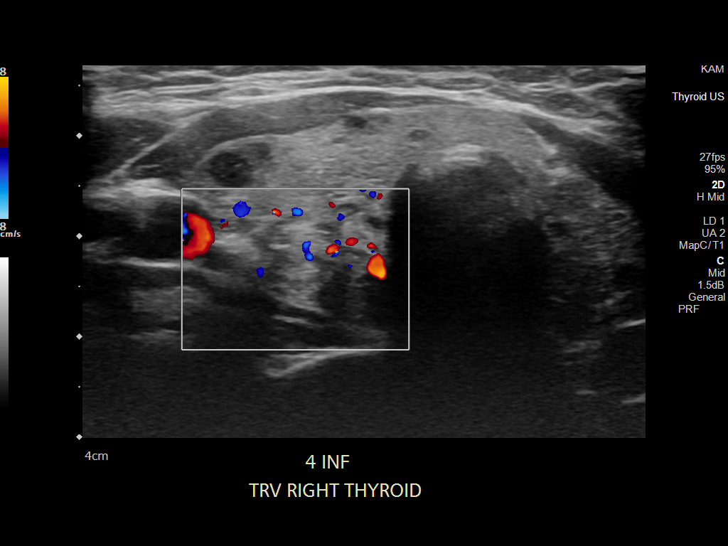
[im 68/102]
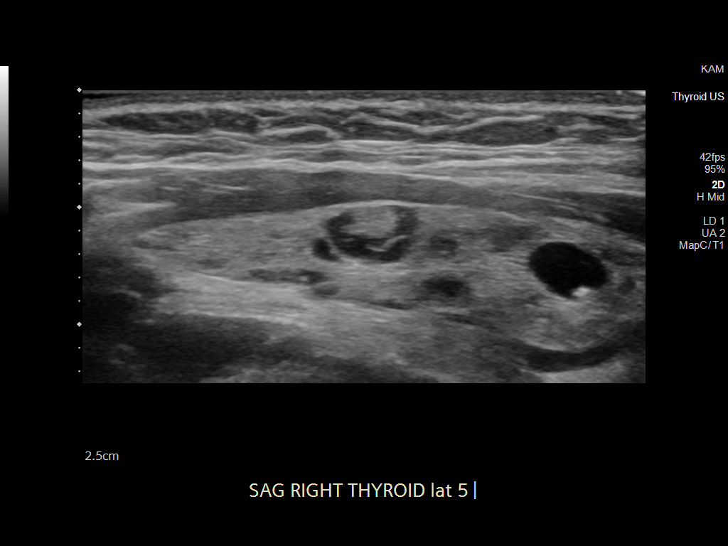
[im 76/102]
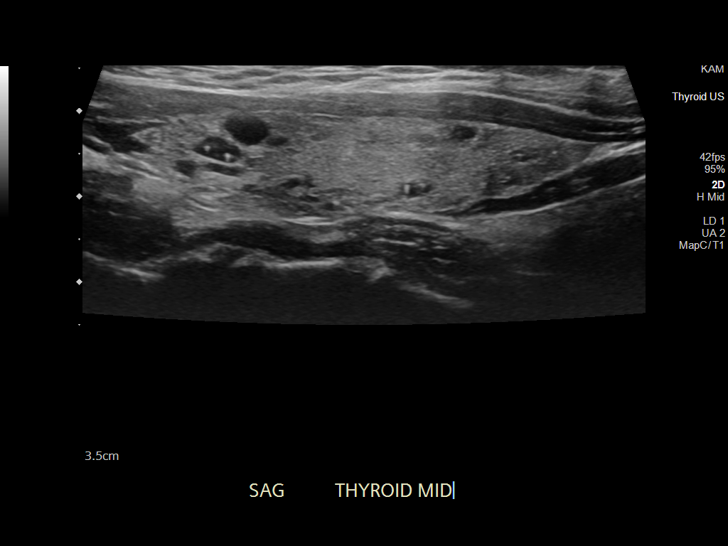
[im 85/102]
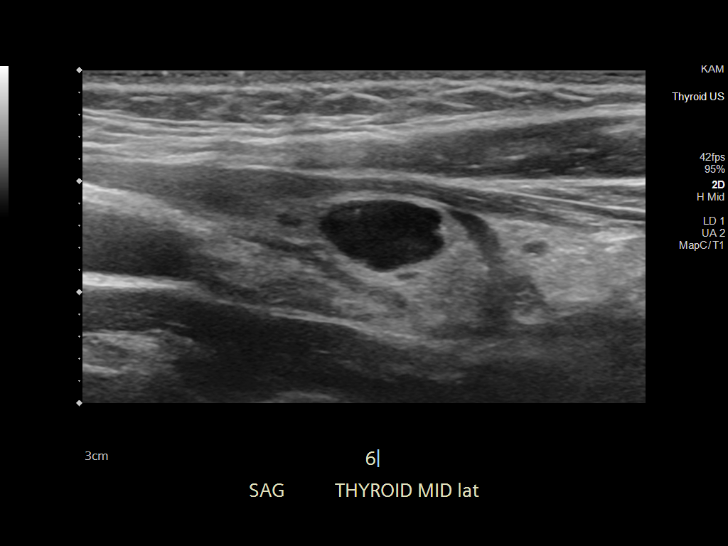
[im 93/102]
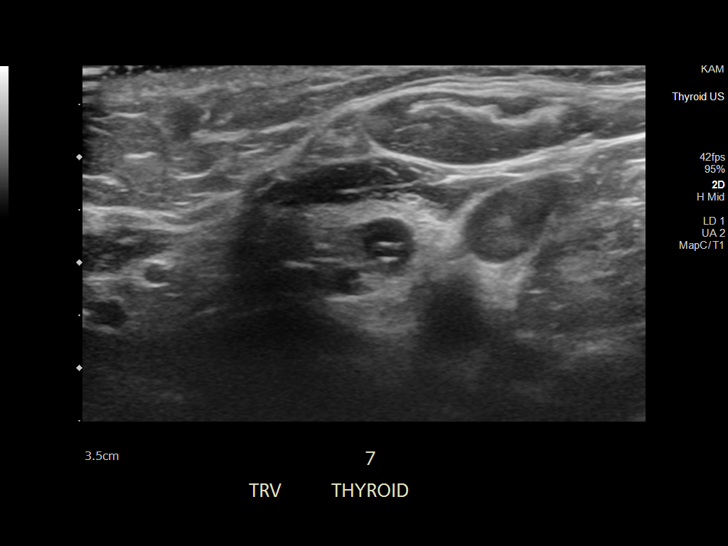
[im 102/102]
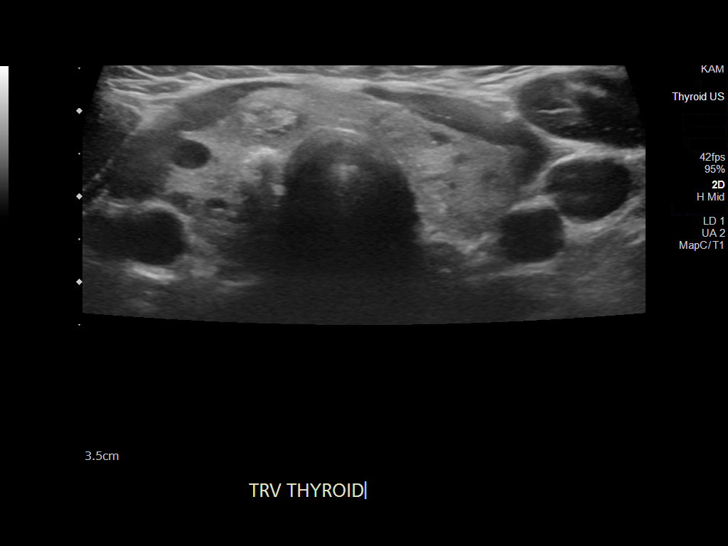

[13 of 25 positions shown; findings below may reference images not displayed]

FINDINGS: Parenchymal Echotexture: Moderately heterogenous

Isthmus: 0.4 cm

Right lobe: 5.2 x 1.8 x 1.9 cm

Left lobe: 5.5 x 1.1 x 1.8 cm

_________________________________________________________

Estimated total number of nodules >/= 1 cm: 3

Number of spongiform nodules >/=  2 cm not described below (TR1): 0

Number of mixed cystic and solid nodules >/= 1.5 cm not described
below (TR2): 0

_________________________________________________________

Nodule # 1 (previously labeled nodule 4):

Prior biopsy: No

Location: Right; inferior and at the junction of the right lobe and
isthmus.

Maximum size: 1.3 cm; Other 2 dimensions: 0.6 x 0.7 cm, previously,
1.2 x 0.6 x 0.7 cm (previously stated dimensions were incorrect and
reflected dimensions of an adjacent nodule in the right lobe).

Composition: solid/almost completely solid (2)

Echogenicity: cannot determine (1)

Shape: not taller-than-wide (0)

Margins: ill-defined (0)

Echogenic foci: macrocalcifications (1)

ACR TI-RADS total points: 4.

ACR TI-RADS risk category:  TR4 (4-6 points).

Significant change in size (>/= 20% in two dimensions and minimal
increase of 2 mm): No

Change in features: No

Change in ACR TI-RADS risk category: No

ACR TI-RADS recommendations:

This area of nodularity may be minimally more elongated in the
sagittal plane and there is suggestion slightly more prominent
shadowing calcification along its superior pole.

_________________________________________________________

Other cystic and partially solid/partially cystic nodules scattered
throughout both lobes are stable and do not meet criteria for biopsy
or follow-up.

No abnormal lymph nodes identified by ultrasound.
IMPRESSION: Relatively stable nodule at the juncture of the inferior right lobe
with the isthmus. This nodule may be minimally larger in the
sagittal plane and demonstrates slight increase in macroscopic
calcification within its superior aspect. One year follow-up
ultrasound would be appropriate. Fine needle aspiration could also
be considered and would not be entirely inappropriate given
borderline characteristics of this nodule.

The above is in keeping with the ACR TI-RADS recommendations - [HOSPITAL] 9263;[DATE].

## 2022-09-16 IMAGING — US US PELVIS COMPLETE WITH TRANSVAGINAL
1 series · 14 of 25 positions shown · non-contrast
Comparison: None

CLINICAL DATA: Check IUD position.  Irregular bleeding.



[Series 1: us pelvic complete with transvaginal · 86 acquisitions, 14 frames shown]
[im 1/86]
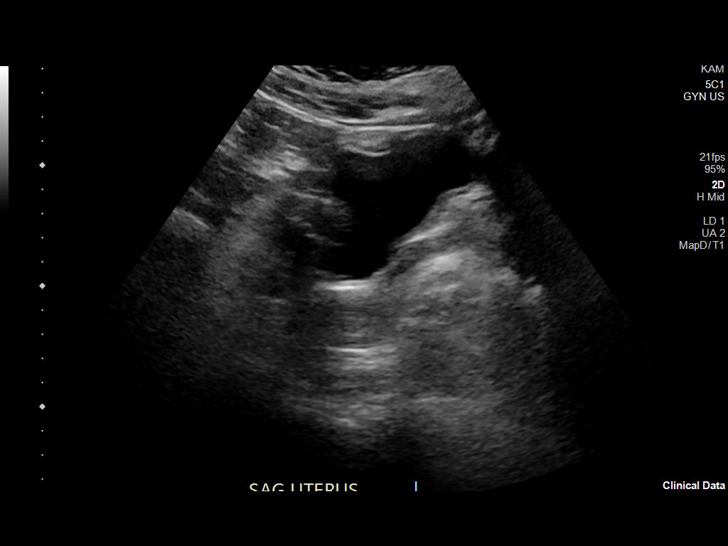
[im 8/86]
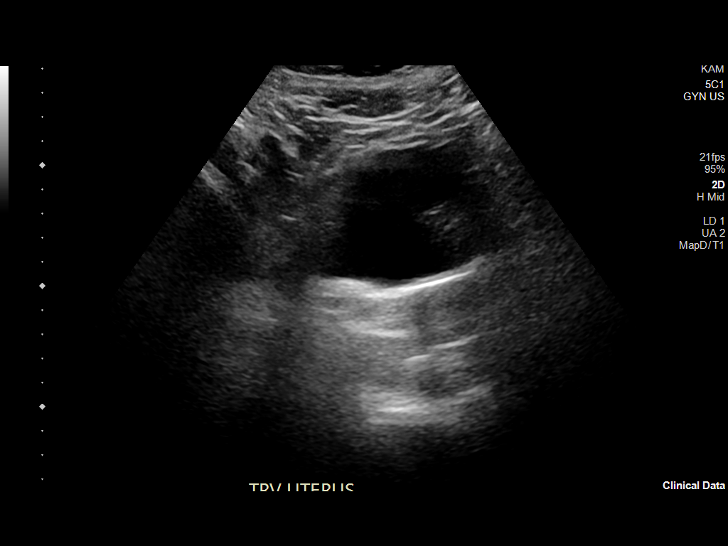
[im 15/86]
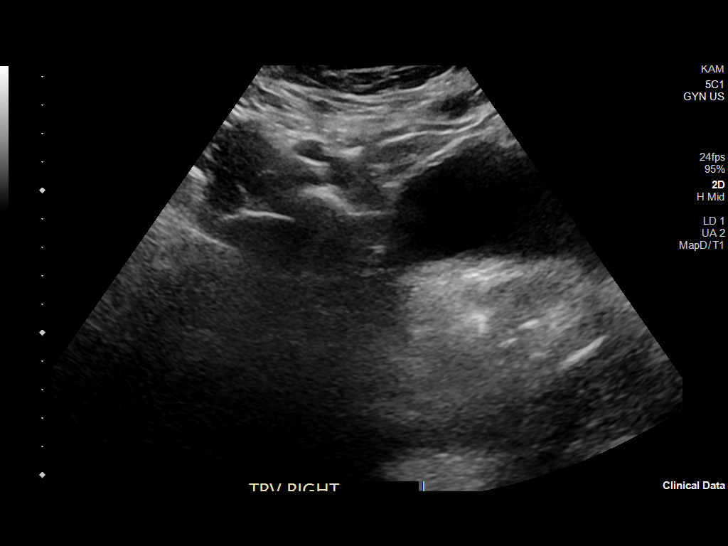
[im 22/86]
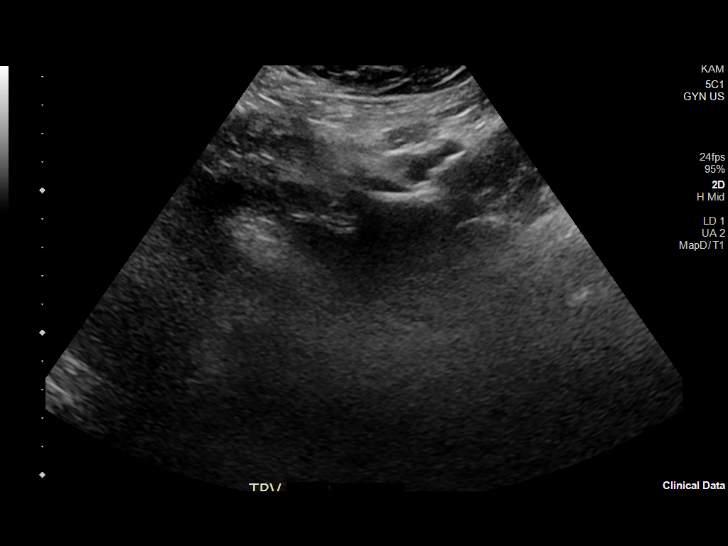
[im 29/86]
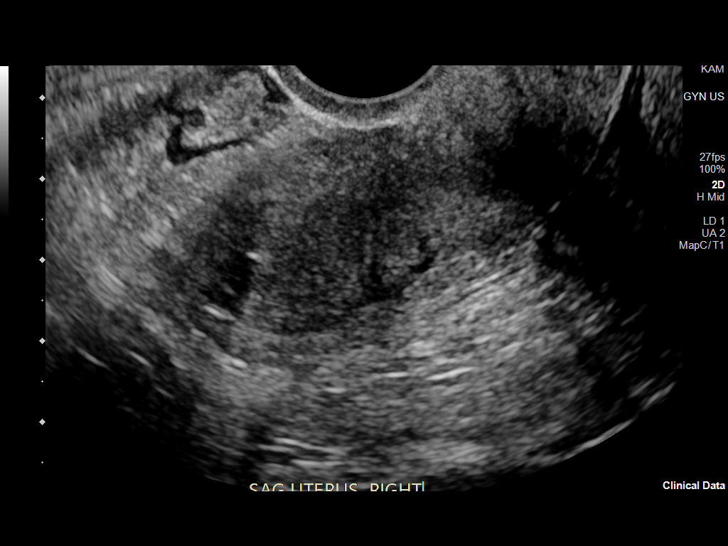
[im 32/86]
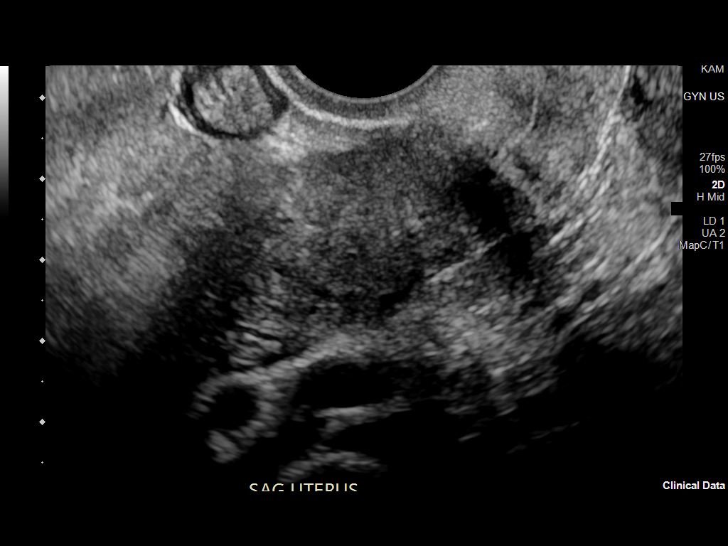
[im 39/86]
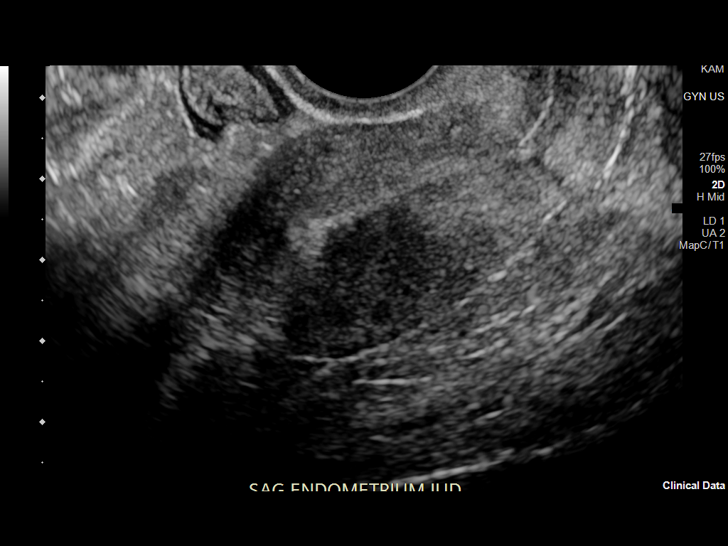
[im 47/86]
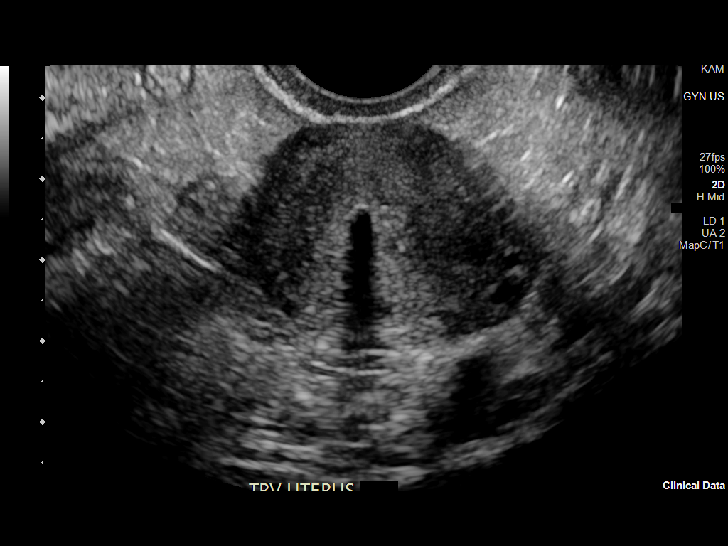
[im 54/86]
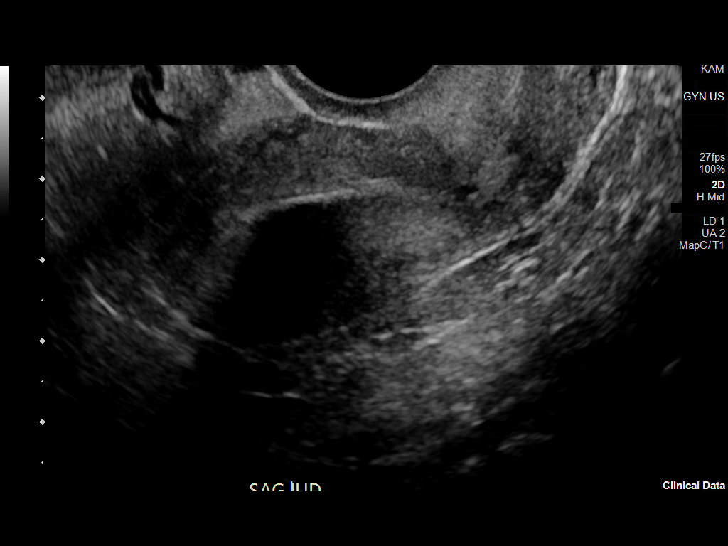
[im 57/86]
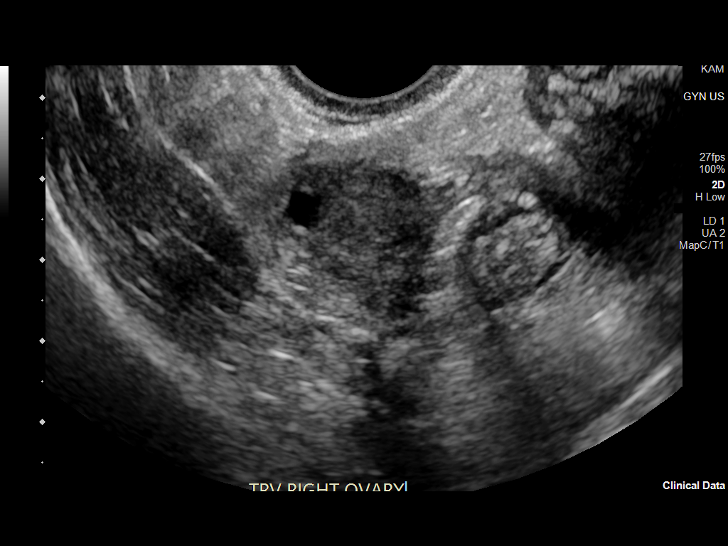
[im 64/86]
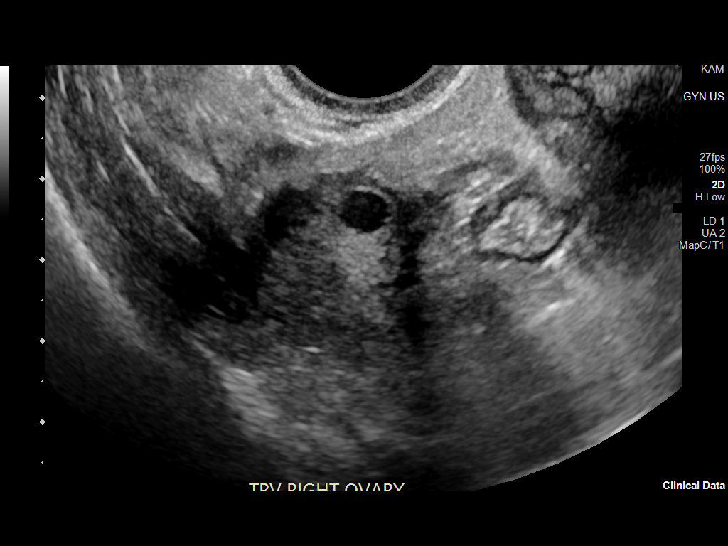
[im 71/86]
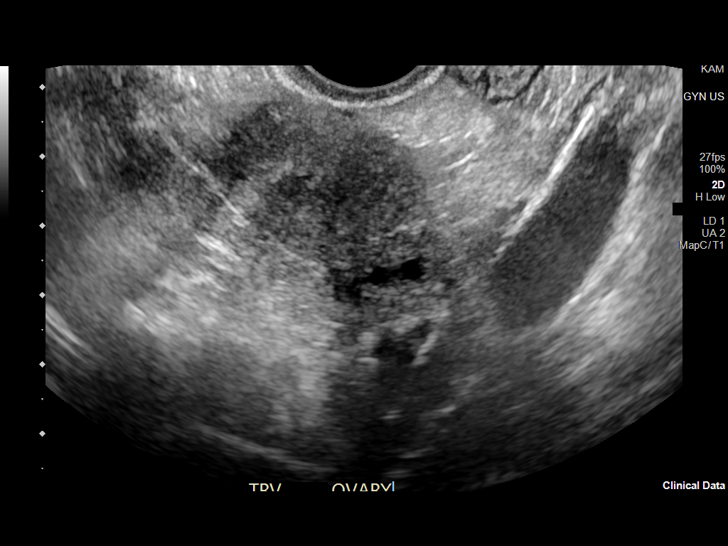
[im 78/86]
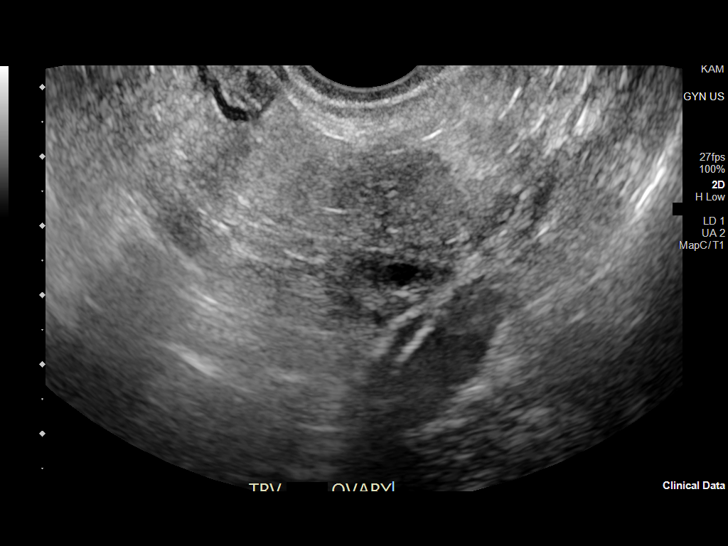
[im 86/86]
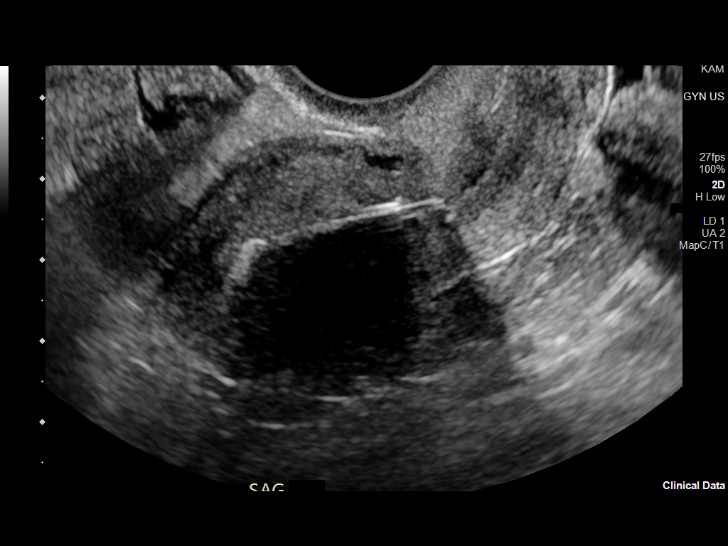

[14 of 25 positions shown; findings below may reference images not displayed]

FINDINGS: Uterus

Measurements: 6.7 x 2.8 x 3.2 cm = volume: 32 mL. No fibroids or
other mass visualized.

Endometrium

Thickness: 6 mm in thickness. IUD in expected position. No
endometrial mass.

Right ovary

Measurements: 2.7 x 2.2 x 2.4 cm = volume: 7.5 mL. Normal
appearance/no adnexal mass.

Left ovary

Measurements: 2.0 x 1.2 x 1.6 cm = volume: 2 mL. Normal
appearance/no adnexal mass.

Other findings

No abnormal free fluid.
IMPRESSION: IUD in expected position within the endometrium.

No acute findings.

## 2022-10-09 DIAGNOSIS — F4323 Adjustment disorder with mixed anxiety and depressed mood: Secondary | ICD-10-CM | POA: Diagnosis not present

## 2022-10-16 DIAGNOSIS — Z79899 Other long term (current) drug therapy: Secondary | ICD-10-CM | POA: Diagnosis not present

## 2022-10-16 DIAGNOSIS — M0579 Rheumatoid arthritis with rheumatoid factor of multiple sites without organ or systems involvement: Secondary | ICD-10-CM | POA: Diagnosis not present

## 2022-10-18 DIAGNOSIS — F4323 Adjustment disorder with mixed anxiety and depressed mood: Secondary | ICD-10-CM | POA: Diagnosis not present

## 2022-10-19 ENCOUNTER — Encounter (HOSPITAL_BASED_OUTPATIENT_CLINIC_OR_DEPARTMENT_OTHER): Payer: Self-pay | Admitting: Family Medicine

## 2022-10-19 ENCOUNTER — Other Ambulatory Visit (HOSPITAL_BASED_OUTPATIENT_CLINIC_OR_DEPARTMENT_OTHER): Payer: BC Managed Care – PPO

## 2022-10-19 ENCOUNTER — Ambulatory Visit (HOSPITAL_BASED_OUTPATIENT_CLINIC_OR_DEPARTMENT_OTHER): Payer: BC Managed Care – PPO | Admitting: Family Medicine

## 2022-10-19 VITALS — BP 136/96 | HR 74 | Temp 97.8°F | Ht 62.5 in | Wt 216.4 lb

## 2022-10-19 DIAGNOSIS — J019 Acute sinusitis, unspecified: Secondary | ICD-10-CM | POA: Diagnosis not present

## 2022-10-19 DIAGNOSIS — R748 Abnormal levels of other serum enzymes: Secondary | ICD-10-CM

## 2022-10-19 DIAGNOSIS — J329 Chronic sinusitis, unspecified: Secondary | ICD-10-CM | POA: Insufficient documentation

## 2022-10-19 MED ORDER — BENZONATATE 200 MG PO CAPS: 200.0000 mg | ORAL_CAPSULE | Freq: Three times a day (TID) | ORAL | 0 refills | Status: DC | PRN

## 2022-10-19 MED ORDER — AMOXICILLIN 875 MG PO TABS: 875.0000 mg | ORAL_TABLET | Freq: Two times a day (BID) | ORAL | 0 refills | Status: AC

## 2022-10-19 NOTE — Patient Instructions (Addendum)
  Medication Instructions:  Your physician recommends that you continue on your current medications as directed. Please refer to the Current Medication list given to you today. --If you need a refill on any your medications before your next appointment, please call your pharmacy first. If no refills are authorized on file call the office.-- Lab Work: Your physician has recommended that you have lab work today: No If you have labs (blood work) drawn today and your tests are completely normal, you will receive your results via [place] a phone call from our staff.  Please ensure you check your voicemail in the event that you authorized detailed messages to be left on a delegated number. If you have any lab test that is abnormal or we need to change your treatment, we will call you to review the results.  Referrals/Procedures/Imaging: No  Follow-Up: Your next appointment:   Your physician recommends that you schedule a follow-up appointment as need with Dr. de [place].  You will receive a text message or e-mail with a link to a survey about your care and experience with [place] today! We would greatly appreciate your feedback!   Thanks for letting us be apart of your health journey!!  Primary Care and Sports Medicine   Dr. Arlina Robes [place]   We encourage you to activate your patient portal called "MyChart".  Sign up information is provided on this After Visit Summary.  MyChart is used to connect with patients for Virtual Visits (Telemedicine).  Patients are able to view lab/test results, encounter notes, upcoming appointments, etc.  Non-urgent messages can be sent to your provider as well. To learn more about what you can do with MyChart, please visit --  [email or web address].

## 2022-10-19 NOTE — Assessment & Plan Note (Signed)
Patient presents for evaluation upper respiratory symptoms.  She reports that starting about 6 days ago, she began to experience cough, sinus congestion chest congestion. She has been utilizing OTC medications including Mucinex, lozenges to help with symptom relief.  She has not had any fevers, no significant shortness of breath.  She has been having some interrupted sleep related to continued coughing.  Primarily presenting today for continued symptoms. On exam, patient is in no acute distress, does have intermittent coughing during visit, some slight strain to her voice.  Cardiovascular exam with regular rate and rhythm, no murmur appreciated.  Lungs clear to auscultation bilaterally.  Mild pharyngeal erythema, no tonsillar exudate.  No significant cervical lymphadenopathy. Given duration of symptoms, it is possible that what initially started as viral infection may be transitioning to bacterial infection, discussed this with patient.  Would recommend continuing with OTC medications and we can also look to try to help with better control of cough with use of Tessalon Perles.  We also discussed potential role for antibiotics and patient would like to proceed with this, we will proceed with amoxicillin, she denies any issues with amoxicillin or penicillin in the past.  Instructed on completing full course of antibiotics and that we would expect to see gradual improvement over the coming days.  Did discuss that cough can linger at times up to 4 to 6 weeks after improvement of symptoms. We will plan for follow-up as needed

## 2022-10-19 NOTE — Progress Notes (Signed)
    Procedures performed today:    None.  Independent interpretation of notes and tests performed by another provider:   None.  Brief History, Exam, Impression, and Recommendations:    BP (!) 136/96 (BP Location: Left Arm, Patient Position: Sitting, Cuff Size: Normal)   Pulse 74   Temp 97.8 F (36.6 C) (Oral)   Ht 5' 2.5" (1.588 m)   Wt 216 lb 6.4 oz (98.2 kg)   SpO2 98%   BMI 38.95 kg/m   Sinusitis Patient presents for evaluation upper respiratory symptoms.  She reports that starting about 6 days ago, she began to experience cough, sinus congestion chest congestion. She has been utilizing OTC medications including Mucinex, lozenges to help with symptom relief.  She has not had any fevers, no significant shortness of breath.  She has been having some interrupted sleep related to continued coughing.  Primarily presenting today for continued symptoms. On exam, patient is in no acute distress, does have intermittent coughing during visit, some slight strain to her voice.  Cardiovascular exam with regular rate and rhythm, no murmur appreciated.  Lungs clear to auscultation bilaterally.  Mild pharyngeal erythema, no tonsillar exudate.  No significant cervical lymphadenopathy. Given duration of symptoms, it is possible that what initially started as viral infection may be transitioning to bacterial infection, discussed this with patient.  Would recommend continuing with OTC medications and we can also look to try to help with better control of cough with use of Tessalon Perles.  We also discussed potential role for antibiotics and patient would like to proceed with this, we will proceed with amoxicillin, she denies any issues with amoxicillin or penicillin in the past.  Instructed on completing full course of antibiotics and that we would expect to see gradual improvement over the coming days.  Did discuss that cough can linger at times up to 4 to 6 weeks after improvement of symptoms. We will  plan for follow-up as needed  Return if symptoms worsen or fail to improve.   ___________________________________________ Coree Brame de [place], MD, ABFM, CAQSM Primary Care and [place]

## 2022-10-20 LAB — HEPATIC FUNCTION PANEL
ALT: 32 [IU]/L (ref 0–32)
AST: 29 [IU]/L (ref 0–40)
Albumin: 5 g/dL — ABNORMAL HIGH (ref 3.9–4.9)
Alkaline Phosphatase: 80 [IU]/L (ref 44–121)
Bilirubin Total: 0.4 mg/dL (ref 0.0–1.2)
Bilirubin, Direct: 0.12 mg/dL (ref 0.00–0.40)
Total Protein: 8 g/dL (ref 6.0–8.5)

## 2022-10-24 ENCOUNTER — Other Ambulatory Visit (HOSPITAL_BASED_OUTPATIENT_CLINIC_OR_DEPARTMENT_OTHER): Payer: Self-pay | Admitting: Obstetrics & Gynecology

## 2022-10-24 ENCOUNTER — Encounter (HOSPITAL_BASED_OUTPATIENT_CLINIC_OR_DEPARTMENT_OTHER): Payer: Self-pay | Admitting: Obstetrics & Gynecology

## 2022-10-24 MED ORDER — FLUCONAZOLE 150 MG PO TABS: 150.0000 mg | ORAL_TABLET | Freq: Once | ORAL | 0 refills | Status: AC

## 2022-11-01 DIAGNOSIS — F4323 Adjustment disorder with mixed anxiety and depressed mood: Secondary | ICD-10-CM | POA: Diagnosis not present

## 2022-11-08 DIAGNOSIS — F4323 Adjustment disorder with mixed anxiety and depressed mood: Secondary | ICD-10-CM | POA: Diagnosis not present

## 2022-11-13 DIAGNOSIS — M0579 Rheumatoid arthritis with rheumatoid factor of multiple sites without organ or systems involvement: Secondary | ICD-10-CM | POA: Diagnosis not present

## 2022-11-15 DIAGNOSIS — F4323 Adjustment disorder with mixed anxiety and depressed mood: Secondary | ICD-10-CM | POA: Diagnosis not present

## 2022-11-23 ENCOUNTER — Ambulatory Visit (INDEPENDENT_AMBULATORY_CARE_PROVIDER_SITE_OTHER): Payer: BC Managed Care – PPO | Admitting: Family Medicine

## 2022-11-23 ENCOUNTER — Encounter (HOSPITAL_BASED_OUTPATIENT_CLINIC_OR_DEPARTMENT_OTHER): Payer: Self-pay | Admitting: Family Medicine

## 2022-11-23 VITALS — BP 104/71 | HR 83 | Resp 20 | Ht 62.5 in | Wt 217.3 lb

## 2022-11-23 DIAGNOSIS — Z0000 Encounter for general adult medical examination without abnormal findings: Secondary | ICD-10-CM | POA: Diagnosis not present

## 2022-11-23 DIAGNOSIS — F4323 Adjustment disorder with mixed anxiety and depressed mood: Secondary | ICD-10-CM | POA: Diagnosis not present

## 2022-11-23 DIAGNOSIS — M722 Plantar fascial fibromatosis: Secondary | ICD-10-CM

## 2022-11-23 NOTE — Assessment & Plan Note (Signed)
Patient reports that she has been having some bilateral heel pain.  This has been going on for several months, feels that has been gradually worsening during this time.  Pain is primarily over plantar aspect of heel bilaterally.  Denies any prior similar symptoms.  Pain is worst with first step out of bed.  Will be painful with increased activities.  She has been making some adjustments to footwear. On exam, she has tenderness to palpation over plantar heel.  No pain about the ankle otherwise.  Distal neurovascular exam intact.  Negative windlass test. Patient exam are most consistent with plantar fasciitis.  Reviewed conservative measures, icing, massage, stretching, anterior night splint, Tuli's heel cups.  Can also consider referral to physical therapy, she wishes to hold off on this for now.

## 2022-11-23 NOTE — Assessment & Plan Note (Signed)
Routine HCM labs reviewed. HCM reviewed/discussed. Anticipatory guidance regarding healthy weight, lifestyle and choices given. Recommend healthy diet.  Recommend approximately 150 minutes/week of moderate intensity exercise Recommend regular dental and vision exams Always use seatbelt/lap and shoulder restraints Recommend using smoke alarms and checking batteries at least twice a year Recommend using sunscreen when outside Discussed tetanus immunization recommendations, patient is UTD 

## 2022-11-23 NOTE — Progress Notes (Signed)
Subjective:    CC: Annual Physical Exam  HPI:  Tamara Vega is a 36 y.o. presenting for annual physical  I reviewed the past medical history, family history, social history, surgical history, and allergies today and no changes were needed.  Please see the problem list section below in epic for further details.  Past Medical History: Past Medical History:  Diagnosis Date   Allergy    Cancer [facility]) August 2022   Papillary thyroid Cancer   Chronic headache    Clotting disorder [facility])    elementary age nose bleeds   Depression    Dysmenorrhea    Family history of diabetes mellitus    Generalized anxiety disorder    GERD (gastroesophageal reflux disease)    High risk medication use    Humira, PPI   Hypertension    Insomnia    Obesity    Rheumatoid arthritis ([place]) 10/2014   started Humira 04/2016; Dr. Gavin Pound   Seasonal allergies    Past Surgical History: Past Surgical History:  Procedure Laterality Date   ESOPHAGOGASTRODUODENOSCOPY  01/2010   [place], [place]   TOTAL THYROIDECTOMY  11/02/2021   Social History: Social History   Socioeconomic History   Marital status: Single    Spouse name: Not on file   Number of children: Not on file   Years of education: Not on file   Highest education level: Not on file  Occupational History   Not on file  Tobacco Use   Smoking status: Never   Smokeless tobacco: Never  Vaping Use   Vaping Use: Never used  Substance and Sexual Activity   Alcohol use: Yes    Alcohol/week: 3.0 standard drinks of alcohol    Types: 2 Glasses of wine, 1 Standard drinks or equivalent per week   Drug use: No   Sexual activity: Not Currently    Birth control/protection: Coitus interruptus, Condom, I.U.D.  Other Topics Concern   Not on file  Social History Narrative   Single, attorney, self employed 2019, was formerly at [employer].   Drinks about 3L water daily.   Started Weight [employer] 11/2018.   [place] 12/2018, 4 times per  week, doing facebook live zoom workouts, also doing long walks 6-10 mile walks.  No significant other currently. 06/2020   Social Determinants of Health   Financial Resource Strain: Not on file  Food Insecurity: Not on file  Transportation Needs: Not on file  Physical Activity: Not on file  Stress: Not on file  Social Connections: Not on file   Family History: Family History  Problem Relation Age of Onset   Diabetes Father    Hypertension Father    High Cholesterol Father    Obesity Father    Diabetes Mother    Hypertension Mother    High Cholesterol Mother    Arthritis Mother    Diabetes Sister    Hypertension Sister    Obesity Sister    Heart disease Maternal Uncle        MI   Heart disease Maternal Grandmother        MI   Heart disease Maternal Grandfather        MI   Cancer Neg Hx    Allergies: Allergies  Allergen Reactions   Bactrim [Sulfamethoxazole-Trimethoprim] Swelling    reddness and itching    Diclofenac     Blood in stool once 2016   Tdvax [Tetanus-Diphtheria Toxoids Td]     Td vaccine - swollen  red painful injection site, fatigue, headache   Medications: See med rec.  Review of Systems: No headache, visual changes, nausea, vomiting, diarrhea, constipation, dizziness, abdominal pain, skin rash, fevers, chills, night sweats, swollen lymph nodes, weight loss, chest pain, body aches, joint swelling, muscle aches, shortness of breath, mood changes, visual or auditory hallucinations.  Objective:    BP 104/71 (BP Location: Right Arm, Patient Position: Sitting, Cuff Size: Large)   Pulse 83   Resp 20   Ht 5' 2.5" (1.588 m)   Wt 217 lb 4.8 oz (98.6 kg)   SpO2 98%   BMI 39.11 kg/m   General: Well Developed, well nourished, and in no acute distress.  Neuro: Alert and oriented x3, extra-ocular muscles intact, sensation grossly intact. Cranial nerves II through XII are intact, motor, sensory, and coordinative functions are all intact. HEENT: Normocephalic,  atraumatic, pupils equal round reactive to light, neck supple, no masses, no lymphadenopathy, thyroid nonpalpable. Oropharynx, nasopharynx, external ear canals are unremarkable. Skin: Warm and dry, no rashes noted.  Cardiac: Regular rate and rhythm, no murmurs rubs or gallops.  Respiratory: Clear to auscultation bilaterally. Not using accessory muscles, speaking in full sentences.  Abdominal: Soft, nontender, nondistended, positive bowel sounds, no masses, no organomegaly.  Musculoskeletal: Shoulder, elbow, wrist, hip, knee, ankle stable, and with full range of motion.  Impression and Recommendations:    Wellness examination Routine HCM labs reviewed. HCM reviewed/discussed. Anticipatory guidance regarding healthy weight, lifestyle and choices given. Recommend healthy diet.  Recommend approximately 150 minutes/week of moderate intensity exercise Recommend regular dental and vision exams Always use seatbelt/lap and shoulder restraints Recommend using smoke alarms and checking batteries at least twice a year Recommend using sunscreen when outside Discussed tetanus immunization recommendations, patient is UTD  Plantar fasciitis, bilateral Patient reports that she has been having some bilateral heel pain.  This has been going on for several months, feels that has been gradually worsening during this time.  Pain is primarily over plantar aspect of heel bilaterally.  Denies any prior similar symptoms.  Pain is worst with first step out of bed.  Will be painful with increased activities.  She has been making some adjustments to footwear. On exam, she has tenderness to palpation over plantar heel.  No pain about the ankle otherwise.  Distal neurovascular exam intact.  Negative windlass test. Patient exam are most consistent with plantar fasciitis.  Reviewed conservative measures, icing, massage, stretching, anterior night splint, Tuli's heel cups.  Can also consider referral to physical therapy, she  wishes to hold off on this for now.  Return in about 2 months (around 01/24/2023) for plantar fasciitis.   ___________________________________________ Markian Glockner de [place], MD, ABFM, [facility] and [place]

## 2022-11-29 DIAGNOSIS — F4323 Adjustment disorder with mixed anxiety and depressed mood: Secondary | ICD-10-CM | POA: Diagnosis not present

## 2022-12-06 DIAGNOSIS — F4323 Adjustment disorder with mixed anxiety and depressed mood: Secondary | ICD-10-CM | POA: Diagnosis not present

## 2022-12-11 DIAGNOSIS — Z79899 Other long term (current) drug therapy: Secondary | ICD-10-CM | POA: Diagnosis not present

## 2022-12-11 DIAGNOSIS — M0579 Rheumatoid arthritis with rheumatoid factor of multiple sites without organ or systems involvement: Secondary | ICD-10-CM | POA: Diagnosis not present

## 2022-12-11 DIAGNOSIS — Z111 Encounter for screening for respiratory tuberculosis: Secondary | ICD-10-CM | POA: Diagnosis not present

## 2022-12-13 DIAGNOSIS — F4323 Adjustment disorder with mixed anxiety and depressed mood: Secondary | ICD-10-CM | POA: Diagnosis not present

## 2022-12-24 ENCOUNTER — Encounter (HOSPITAL_BASED_OUTPATIENT_CLINIC_OR_DEPARTMENT_OTHER): Payer: Self-pay

## 2022-12-24 DIAGNOSIS — D1801 Hemangioma of skin and subcutaneous tissue: Secondary | ICD-10-CM | POA: Diagnosis not present

## 2022-12-24 DIAGNOSIS — L821 Other seborrheic keratosis: Secondary | ICD-10-CM | POA: Diagnosis not present

## 2022-12-24 DIAGNOSIS — L814 Other melanin hyperpigmentation: Secondary | ICD-10-CM | POA: Diagnosis not present

## 2022-12-24 DIAGNOSIS — L578 Other skin changes due to chronic exposure to nonionizing radiation: Secondary | ICD-10-CM | POA: Diagnosis not present

## 2022-12-25 DIAGNOSIS — Z79899 Other long term (current) drug therapy: Secondary | ICD-10-CM | POA: Diagnosis not present

## 2023-01-02 DIAGNOSIS — F4323 Adjustment disorder with mixed anxiety and depressed mood: Secondary | ICD-10-CM | POA: Diagnosis not present

## 2023-01-09 DIAGNOSIS — M0579 Rheumatoid arthritis with rheumatoid factor of multiple sites without organ or systems involvement: Secondary | ICD-10-CM | POA: Diagnosis not present

## 2023-01-10 DIAGNOSIS — F4323 Adjustment disorder with mixed anxiety and depressed mood: Secondary | ICD-10-CM | POA: Diagnosis not present

## 2023-01-25 ENCOUNTER — Ambulatory Visit (HOSPITAL_BASED_OUTPATIENT_CLINIC_OR_DEPARTMENT_OTHER): Payer: BC Managed Care – PPO | Admitting: Family Medicine

## 2023-01-31 DIAGNOSIS — F4323 Adjustment disorder with mixed anxiety and depressed mood: Secondary | ICD-10-CM | POA: Diagnosis not present

## 2023-02-07 DIAGNOSIS — M0579 Rheumatoid arthritis with rheumatoid factor of multiple sites without organ or systems involvement: Secondary | ICD-10-CM | POA: Diagnosis not present

## 2023-02-21 DIAGNOSIS — F4323 Adjustment disorder with mixed anxiety and depressed mood: Secondary | ICD-10-CM | POA: Diagnosis not present

## 2023-02-22 DIAGNOSIS — C73 Malignant neoplasm of thyroid gland: Secondary | ICD-10-CM | POA: Diagnosis not present

## 2023-02-22 DIAGNOSIS — Z9089 Acquired absence of other organs: Secondary | ICD-10-CM | POA: Diagnosis not present

## 2023-02-22 DIAGNOSIS — E890 Postprocedural hypothyroidism: Secondary | ICD-10-CM | POA: Diagnosis not present

## 2023-02-28 DIAGNOSIS — E890 Postprocedural hypothyroidism: Secondary | ICD-10-CM | POA: Diagnosis not present

## 2023-03-07 DIAGNOSIS — M0579 Rheumatoid arthritis with rheumatoid factor of multiple sites without organ or systems involvement: Secondary | ICD-10-CM | POA: Diagnosis not present

## 2023-03-11 DIAGNOSIS — L700 Acne vulgaris: Secondary | ICD-10-CM | POA: Diagnosis not present

## 2023-03-11 DIAGNOSIS — L299 Pruritus, unspecified: Secondary | ICD-10-CM | POA: Diagnosis not present

## 2023-03-11 DIAGNOSIS — F4323 Adjustment disorder with mixed anxiety and depressed mood: Secondary | ICD-10-CM | POA: Diagnosis not present

## 2023-03-11 DIAGNOSIS — L739 Follicular disorder, unspecified: Secondary | ICD-10-CM | POA: Diagnosis not present

## 2023-03-14 DIAGNOSIS — M0579 Rheumatoid arthritis with rheumatoid factor of multiple sites without organ or systems involvement: Secondary | ICD-10-CM | POA: Diagnosis not present

## 2023-03-14 DIAGNOSIS — Z79899 Other long term (current) drug therapy: Secondary | ICD-10-CM | POA: Diagnosis not present

## 2023-03-18 ENCOUNTER — Other Ambulatory Visit (HOSPITAL_BASED_OUTPATIENT_CLINIC_OR_DEPARTMENT_OTHER): Payer: Self-pay | Admitting: Family Medicine

## 2023-03-18 DIAGNOSIS — F411 Generalized anxiety disorder: Secondary | ICD-10-CM | POA: Diagnosis not present

## 2023-03-18 DIAGNOSIS — I10 Essential (primary) hypertension: Secondary | ICD-10-CM

## 2023-03-18 DIAGNOSIS — F410 Panic disorder [episodic paroxysmal anxiety] without agoraphobia: Secondary | ICD-10-CM | POA: Diagnosis not present

## 2023-03-19 ENCOUNTER — Other Ambulatory Visit: Payer: Self-pay | Admitting: Rheumatology

## 2023-03-19 DIAGNOSIS — M0579 Rheumatoid arthritis with rheumatoid factor of multiple sites without organ or systems involvement: Secondary | ICD-10-CM

## 2023-03-25 DIAGNOSIS — F4323 Adjustment disorder with mixed anxiety and depressed mood: Secondary | ICD-10-CM | POA: Diagnosis not present

## 2023-04-04 DIAGNOSIS — F4323 Adjustment disorder with mixed anxiety and depressed mood: Secondary | ICD-10-CM | POA: Diagnosis not present

## 2023-04-04 DIAGNOSIS — M0579 Rheumatoid arthritis with rheumatoid factor of multiple sites without organ or systems involvement: Secondary | ICD-10-CM | POA: Diagnosis not present

## 2023-04-11 DIAGNOSIS — F4323 Adjustment disorder with mixed anxiety and depressed mood: Secondary | ICD-10-CM | POA: Diagnosis not present

## 2023-04-12 DIAGNOSIS — L509 Urticaria, unspecified: Secondary | ICD-10-CM | POA: Diagnosis not present

## 2023-04-18 DIAGNOSIS — F4323 Adjustment disorder with mixed anxiety and depressed mood: Secondary | ICD-10-CM | POA: Diagnosis not present

## 2023-04-22 ENCOUNTER — Ambulatory Visit
Admission: RE | Admit: 2023-04-22 | Discharge: 2023-04-22 | Disposition: A | Discharge status: Home / Self Care | Payer: BC Managed Care – PPO | Source: Ambulatory Visit | Attending: Rheumatology | Admitting: Rheumatology

## 2023-04-22 DIAGNOSIS — M0579 Rheumatoid arthritis with rheumatoid factor of multiple sites without organ or systems involvement: Secondary | ICD-10-CM

## 2023-04-22 DIAGNOSIS — Z8249 Family history of ischemic heart disease and other diseases of the circulatory system: Secondary | ICD-10-CM | POA: Diagnosis not present

## 2023-04-25 DIAGNOSIS — F4323 Adjustment disorder with mixed anxiety and depressed mood: Secondary | ICD-10-CM | POA: Diagnosis not present

## 2023-05-02 DIAGNOSIS — M0579 Rheumatoid arthritis with rheumatoid factor of multiple sites without organ or systems involvement: Secondary | ICD-10-CM | POA: Diagnosis not present

## 2023-05-08 ENCOUNTER — Encounter (HOSPITAL_BASED_OUTPATIENT_CLINIC_OR_DEPARTMENT_OTHER): Payer: Self-pay | Admitting: Family Medicine

## 2023-05-09 ENCOUNTER — Ambulatory Visit (HOSPITAL_BASED_OUTPATIENT_CLINIC_OR_DEPARTMENT_OTHER): Payer: BC Managed Care – PPO | Admitting: Family Medicine

## 2023-05-09 ENCOUNTER — Encounter (HOSPITAL_BASED_OUTPATIENT_CLINIC_OR_DEPARTMENT_OTHER): Payer: Self-pay | Admitting: Family Medicine

## 2023-05-09 VITALS — BP 124/88 | HR 72 | Ht 62.5 in | Wt 215.8 lb

## 2023-05-09 DIAGNOSIS — F4323 Adjustment disorder with mixed anxiety and depressed mood: Secondary | ICD-10-CM | POA: Diagnosis not present

## 2023-05-09 DIAGNOSIS — S91001A Unspecified open wound, right ankle, initial encounter: Secondary | ICD-10-CM | POA: Diagnosis not present

## 2023-05-09 DIAGNOSIS — S91009A Unspecified open wound, unspecified ankle, initial encounter: Secondary | ICD-10-CM | POA: Insufficient documentation

## 2023-05-09 DIAGNOSIS — Z0000 Encounter for general adult medical examination without abnormal findings: Secondary | ICD-10-CM

## 2023-05-09 NOTE — Progress Notes (Signed)
    Procedures performed today:    None.  Independent interpretation of notes and tests performed by another provider:   None.  Brief History, Exam, Impression, and Recommendations:    BP 124/88 (BP Location: Right Arm, Patient Position: Sitting, Cuff Size: Normal)   Pulse 72   Ht 5' 2.5" (1.588 m)   Wt 215 lb 12.8 oz (97.9 kg)   SpO2 99%   BMI 38.84 kg/m   Wound of ankle On Monday, patient was shaving and cut outside of right ankle.  She noted small cut in the skin, has experienced some increased pain, redness surrounding the wound.  She initially applied topical bacitracin, but then noticed that medication was expired and discontinued this.  Over the last couple days, patient noted moderate itching, discomfort.  She has not had any drainage, oozing, bleeding. She reports that today, area has felt much improved as compared to the past couple days.  She has not had any fever, chills, sweats. On exam, patient is in no acute distress, vital signs stable.  Lateral aspect of right ankle with small healing wound which is circular in nature, less than half centimeter in diameter.  Mild erythema noted surrounding wound, much less noticeable as compared to picture patient had taken earlier in the week.  No significant tenderness to palpation over the area, no significant warmth.  No oozing or drainage present today. Feel that wound is showing good evidence of healing thus far.  Do not feel that further specific intervention is needed at this time, do not feel that antibiotics are needed.  Would recommend continuing with keeping area clean and dry, can utilize warm soapy water for cleaning.  Cautioned on potential signs and symptoms which would indicate possible soft tissue infection such as increasing redness, pain.  Can continue with monitoring.  If symptoms are worsening, consider initiating short course of antibiotics, likely with Keflex.  Prior plantar fasciitis issue has been doing well with  use of conservative measures, no concerns with this today.  Return in about 7 months (around 12/09/2023) for CPE with FBW 1 week prior.   ___________________________________________ Jovane Foutz de [place], MD, ABFM, CAQSM Primary Care and Sports Medicine [facility]

## 2023-05-09 NOTE — Assessment & Plan Note (Signed)
On Monday, patient was shaving and cut outside of right ankle.  She noted small cut in the skin, has experienced some increased pain, redness surrounding the wound.  She initially applied topical bacitracin, but then noticed that medication was expired and discontinued this.  Over the last couple days, patient noted moderate itching, discomfort.  She has not had any drainage, oozing, bleeding. She reports that today, area has felt much improved as compared to the past couple days.  She has not had any fever, chills, sweats. On exam, patient is in no acute distress, vital signs stable.  Lateral aspect of right ankle with small healing wound which is circular in nature, less than half centimeter in diameter.  Mild erythema noted surrounding wound, much less noticeable as compared to picture patient had taken earlier in the week.  No significant tenderness to palpation over the area, no significant warmth.  No oozing or drainage present today. Feel that wound is showing good evidence of healing thus far.  Do not feel that further specific intervention is needed at this time, do not feel that antibiotics are needed.  Would recommend continuing with keeping area clean and dry, can utilize warm soapy water for cleaning.  Cautioned on potential signs and symptoms which would indicate possible soft tissue infection such as increasing redness, pain.  Can continue with monitoring.  If symptoms are worsening, consider initiating short course of antibiotics, likely with Keflex.

## 2023-05-09 NOTE — Patient Instructions (Signed)
  Medication Instructions:  Your physician recommends that you continue on your current medications as directed. Please refer to the Current Medication list given to you today. --If you need a refill on any your medications before your next appointment, please call your pharmacy first. If no refills are authorized on file call the office.-- Lab Work: Your physician has recommended that you have lab work today: No If you have labs (blood work) drawn today and your tests are completely normal, you will receive your results via MyChart message OR a phone call from our staff.  Please ensure you check your voicemail in the event that you authorized detailed messages to be left on a delegated number. If you have any lab test that is abnormal or we need to change your treatment, we will call you to review the results.  Referrals/Procedures/Imaging: No  Follow-Up: Your next appointment:   Your physician recommends that you schedule a follow-up appointment in: 7 months cpe with Dr. de [place].  You will receive a text message or e-mail with a link to a survey about your care and experience with [place] today! We would greatly appreciate your feedback!   Thanks for letting us be apart of your health journey!!  Primary Care and Sports Medicine   Dr. Ceasar Mons [place]   We encourage you to activate your patient portal called "MyChart".  Sign up information is provided on this After Visit Summary.  MyChart is used to connect with patients for Virtual Visits (Telemedicine).  Patients are able to view lab/test results, encounter notes, upcoming appointments, etc.  Non-urgent messages can be sent to your provider as well. To learn more about what you can do with MyChart, please visit --  [email or web address].

## 2023-05-30 DIAGNOSIS — M0579 Rheumatoid arthritis with rheumatoid factor of multiple sites without organ or systems involvement: Secondary | ICD-10-CM | POA: Diagnosis not present

## 2023-06-03 HISTORY — PX: OTHER SURGICAL HISTORY: SHX169

## 2023-06-10 DIAGNOSIS — F4323 Adjustment disorder with mixed anxiety and depressed mood: Secondary | ICD-10-CM | POA: Diagnosis not present

## 2023-06-17 DIAGNOSIS — F4323 Adjustment disorder with mixed anxiety and depressed mood: Secondary | ICD-10-CM | POA: Diagnosis not present

## 2023-06-27 DIAGNOSIS — F4323 Adjustment disorder with mixed anxiety and depressed mood: Secondary | ICD-10-CM | POA: Diagnosis not present

## 2023-06-27 DIAGNOSIS — M0579 Rheumatoid arthritis with rheumatoid factor of multiple sites without organ or systems involvement: Secondary | ICD-10-CM | POA: Diagnosis not present

## 2023-07-18 DIAGNOSIS — F4323 Adjustment disorder with mixed anxiety and depressed mood: Secondary | ICD-10-CM | POA: Diagnosis not present

## 2023-07-25 DIAGNOSIS — M0579 Rheumatoid arthritis with rheumatoid factor of multiple sites without organ or systems involvement: Secondary | ICD-10-CM | POA: Diagnosis not present

## 2023-07-25 DIAGNOSIS — F4323 Adjustment disorder with mixed anxiety and depressed mood: Secondary | ICD-10-CM | POA: Diagnosis not present

## 2023-07-30 DIAGNOSIS — F4323 Adjustment disorder with mixed anxiety and depressed mood: Secondary | ICD-10-CM | POA: Diagnosis not present

## 2023-08-09 IMAGING — US US ABDOMEN COMPLETE
1 series · 14 of 25 positions shown · non-contrast
Comparison: None Available.

CLINICAL DATA: Elevated LFTs

EXAM:
ABDOMEN ULTRASOUND COMPLETE

[Series 1: us abdomen complete · 0.19mm/px · 14 of 92 slices shown]
[im 1/92]
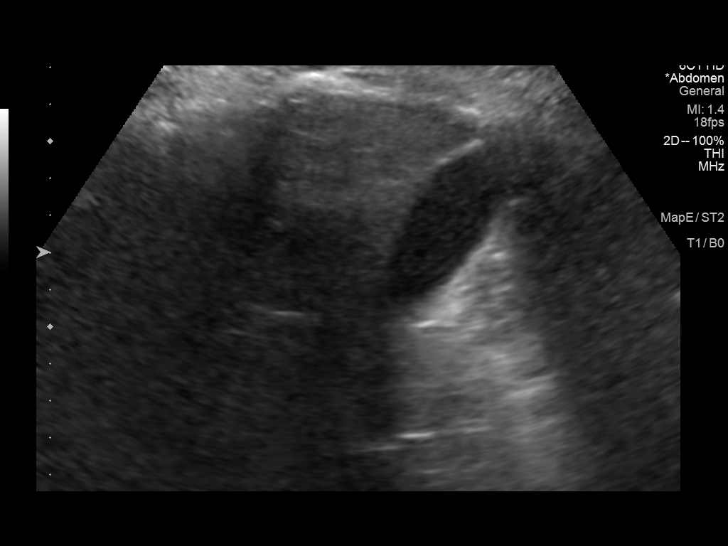
[im 8/92]
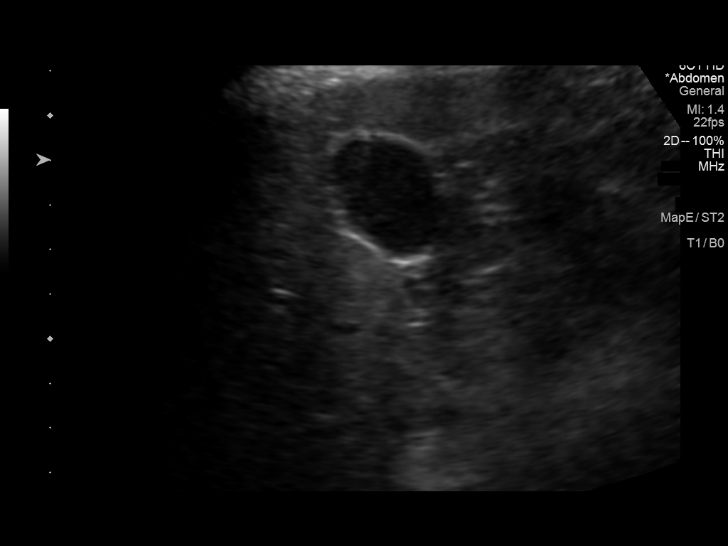
[im 16/92]
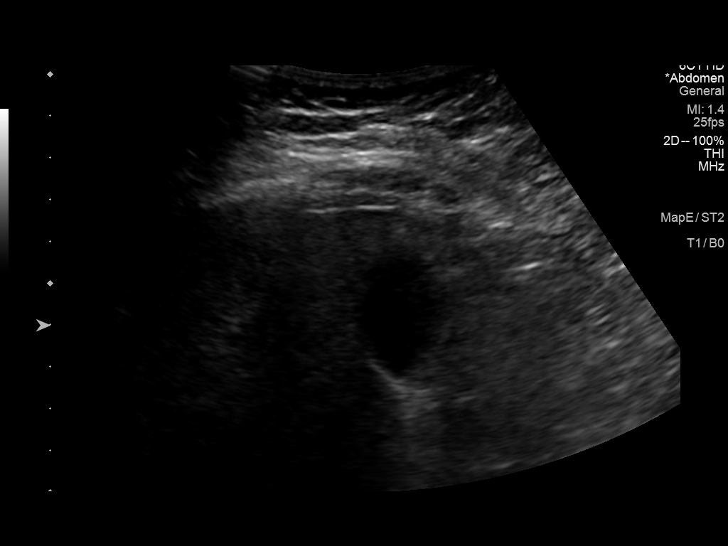
[im 23/92]
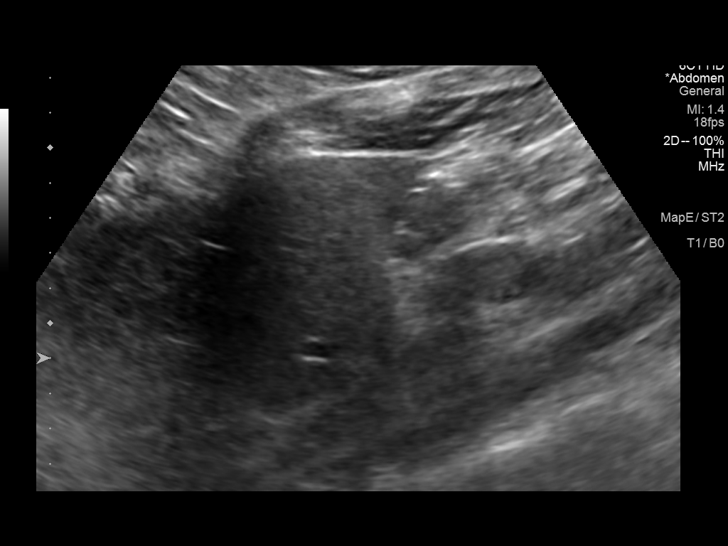
[im 31/92]
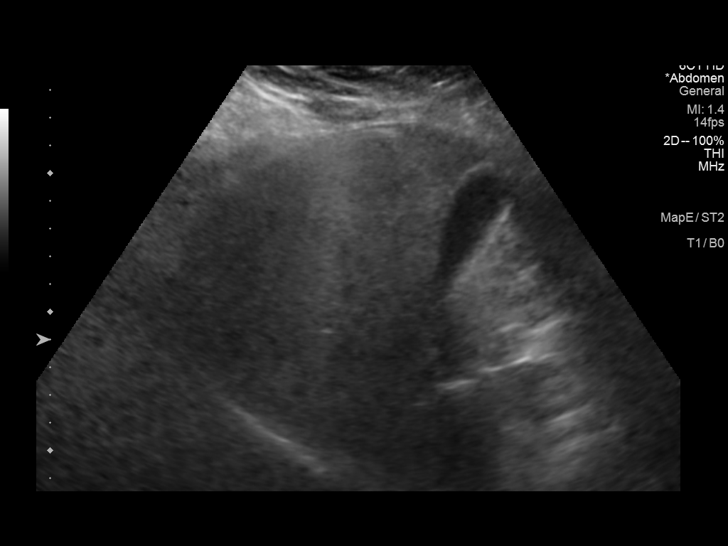
[im 35/92]
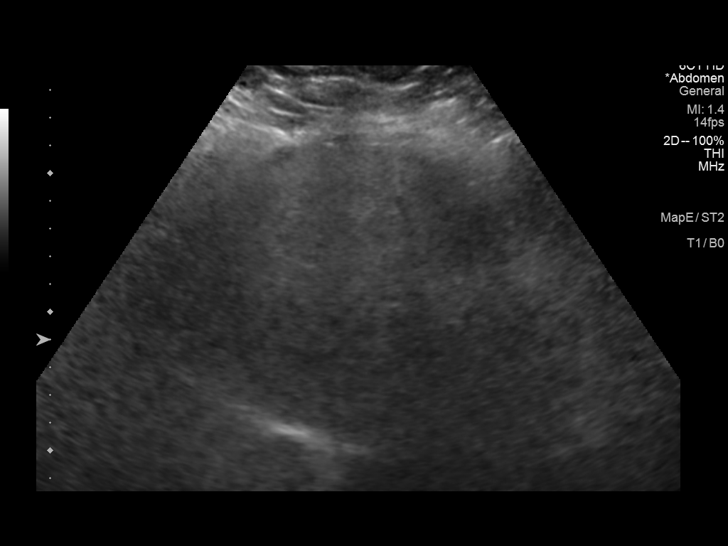
[im 42/92]
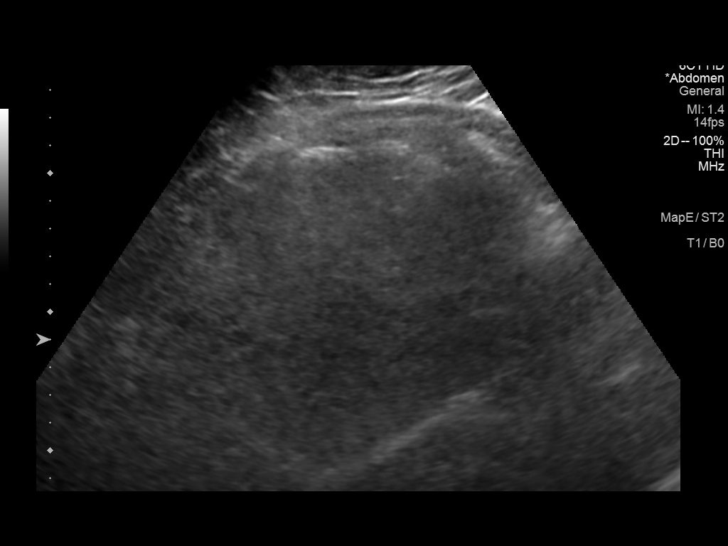
[im 50/92]
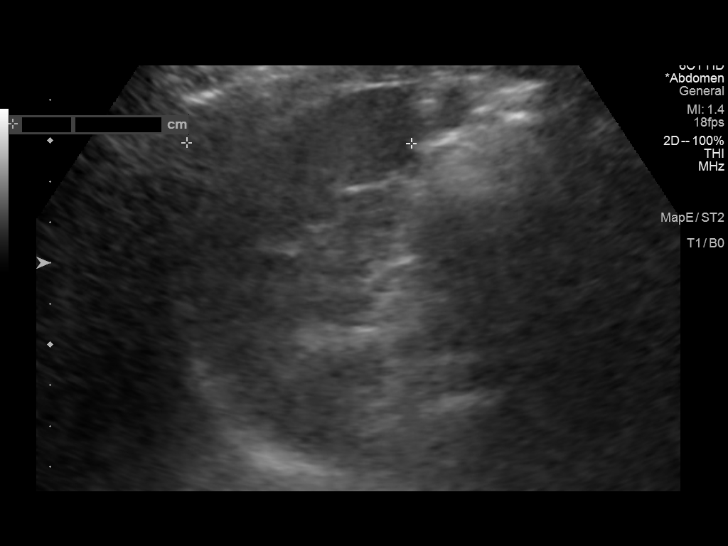
[im 57/92]
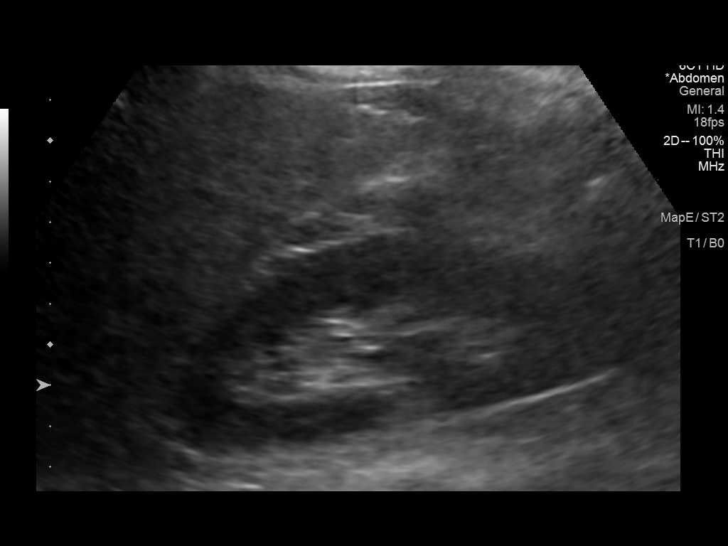
[im 61/92]
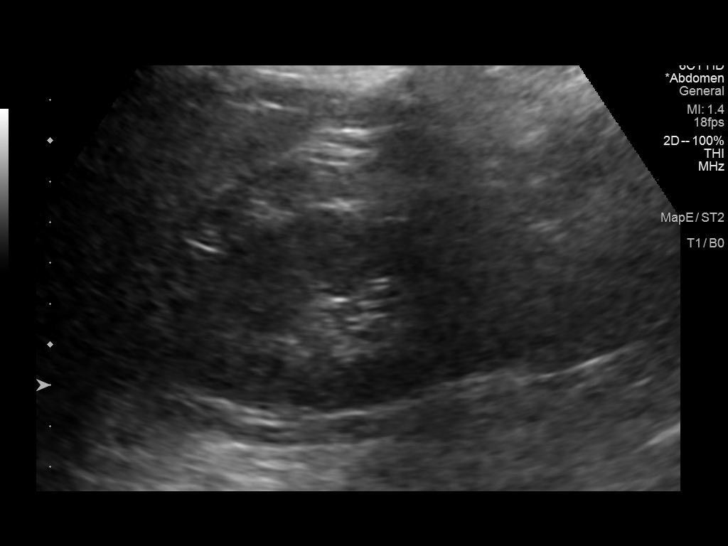
[im 69/92]
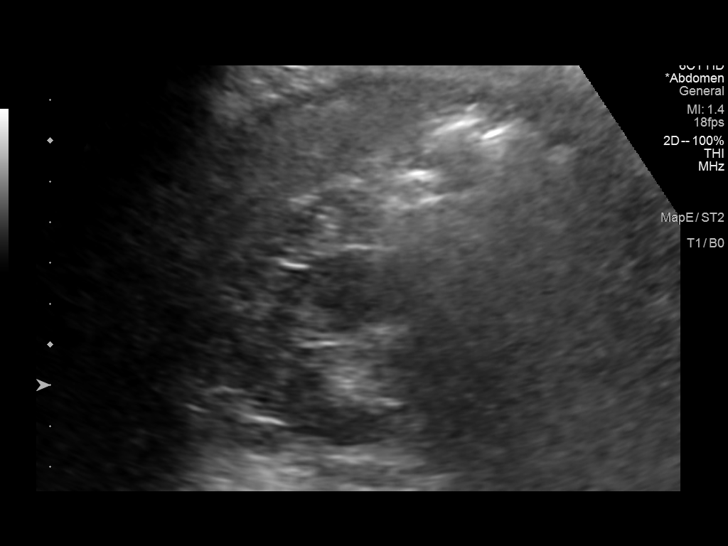
[im 76/92]
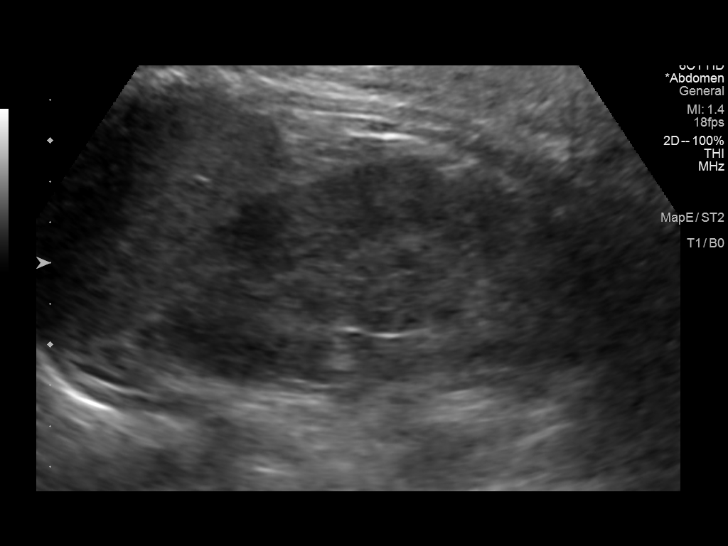
[im 84/92]
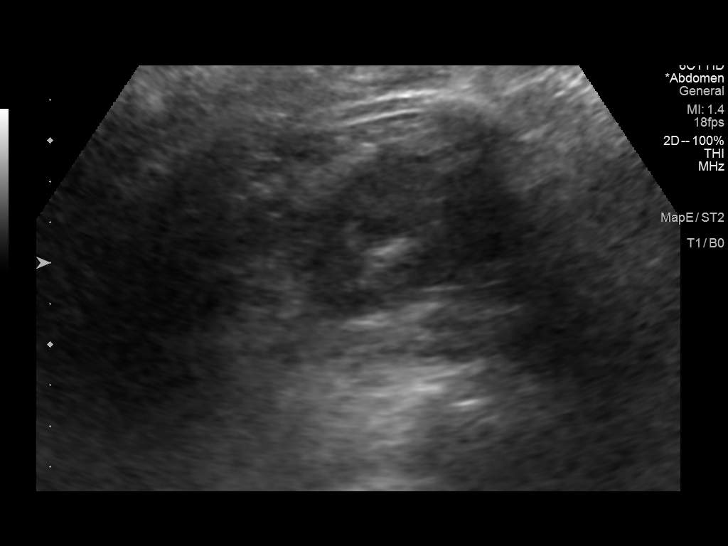
[im 92/92]
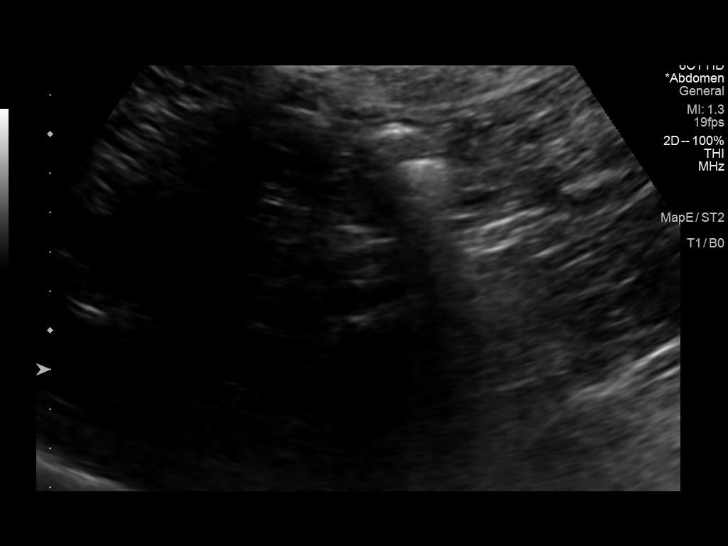

[14 of 25 positions shown; findings below may reference images not displayed]

FINDINGS: Gallbladder: No gallstones or wall thickening visualized. No
sonographic Murphy sign noted by sonographer.

Common bile duct: Diameter: 5 mm

Liver: Coarse, increased echogenicity of the parenchyma with no
focal mass identified. Portal vein is patent on color Doppler
imaging with normal direction of blood flow towards the liver.

IVC: No abnormality visualized.

Pancreas: Visualized portion unremarkable.

Spleen: Size and appearance within normal limits.

Right Kidney: Length: 11.7 cm. Echogenicity within normal limits. No
mass or hydronephrosis visualized.

Left Kidney: Length: 11.5 cm. Echogenicity within normal limits. No
mass or hydronephrosis visualized.

Abdominal aorta: No aneurysm visualized.

Other findings: None.
IMPRESSION: Increased echogenicity of the liver parenchyma suggesting hepatic
steatosis.

## 2023-08-21 DIAGNOSIS — F411 Generalized anxiety disorder: Secondary | ICD-10-CM | POA: Diagnosis not present

## 2023-08-22 DIAGNOSIS — M0579 Rheumatoid arthritis with rheumatoid factor of multiple sites without organ or systems involvement: Secondary | ICD-10-CM | POA: Diagnosis not present

## 2023-08-30 DIAGNOSIS — E890 Postprocedural hypothyroidism: Secondary | ICD-10-CM | POA: Diagnosis not present

## 2023-08-30 DIAGNOSIS — Z85850 Personal history of malignant neoplasm of thyroid: Secondary | ICD-10-CM | POA: Diagnosis not present

## 2023-09-03 DIAGNOSIS — F411 Generalized anxiety disorder: Secondary | ICD-10-CM | POA: Diagnosis not present

## 2023-09-06 DIAGNOSIS — E890 Postprocedural hypothyroidism: Secondary | ICD-10-CM | POA: Diagnosis not present

## 2023-09-06 DIAGNOSIS — C73 Malignant neoplasm of thyroid gland: Secondary | ICD-10-CM | POA: Diagnosis not present

## 2023-09-10 DIAGNOSIS — F4322 Adjustment disorder with anxiety: Secondary | ICD-10-CM | POA: Diagnosis not present

## 2023-09-10 DIAGNOSIS — F411 Generalized anxiety disorder: Secondary | ICD-10-CM | POA: Diagnosis not present

## 2023-09-10 DIAGNOSIS — F410 Panic disorder [episodic paroxysmal anxiety] without agoraphobia: Secondary | ICD-10-CM | POA: Diagnosis not present

## 2023-09-10 DIAGNOSIS — F5101 Primary insomnia: Secondary | ICD-10-CM | POA: Diagnosis not present

## 2023-09-15 DIAGNOSIS — E669 Obesity, unspecified: Secondary | ICD-10-CM | POA: Diagnosis not present

## 2023-09-15 DIAGNOSIS — Z713 Dietary counseling and surveillance: Secondary | ICD-10-CM | POA: Diagnosis not present

## 2023-09-16 DIAGNOSIS — F411 Generalized anxiety disorder: Secondary | ICD-10-CM | POA: Diagnosis not present

## 2023-09-22 DIAGNOSIS — E669 Obesity, unspecified: Secondary | ICD-10-CM | POA: Diagnosis not present

## 2023-09-22 DIAGNOSIS — Z713 Dietary counseling and surveillance: Secondary | ICD-10-CM | POA: Diagnosis not present

## 2023-09-23 DIAGNOSIS — M79642 Pain in left hand: Secondary | ICD-10-CM | POA: Diagnosis not present

## 2023-09-23 DIAGNOSIS — M0579 Rheumatoid arthritis with rheumatoid factor of multiple sites without organ or systems involvement: Secondary | ICD-10-CM | POA: Diagnosis not present

## 2023-09-23 DIAGNOSIS — M79641 Pain in right hand: Secondary | ICD-10-CM | POA: Diagnosis not present

## 2023-09-23 DIAGNOSIS — F4323 Adjustment disorder with mixed anxiety and depressed mood: Secondary | ICD-10-CM | POA: Diagnosis not present

## 2023-09-23 DIAGNOSIS — Z79899 Other long term (current) drug therapy: Secondary | ICD-10-CM | POA: Diagnosis not present

## 2023-09-24 DIAGNOSIS — M0579 Rheumatoid arthritis with rheumatoid factor of multiple sites without organ or systems involvement: Secondary | ICD-10-CM | POA: Diagnosis not present

## 2023-09-29 DIAGNOSIS — E669 Obesity, unspecified: Secondary | ICD-10-CM | POA: Diagnosis not present

## 2023-09-29 DIAGNOSIS — Z713 Dietary counseling and surveillance: Secondary | ICD-10-CM | POA: Diagnosis not present

## 2023-09-30 ENCOUNTER — Ambulatory Visit (HOSPITAL_BASED_OUTPATIENT_CLINIC_OR_DEPARTMENT_OTHER): Payer: BC Managed Care – PPO | Admitting: Obstetrics & Gynecology

## 2023-09-30 ENCOUNTER — Other Ambulatory Visit (HOSPITAL_COMMUNITY)
Admission: RE | Admit: 2023-09-30 | Discharge: 2023-09-30 | Disposition: A | Discharge status: Home / Self Care | Payer: BC Managed Care – PPO | Source: Ambulatory Visit | Attending: Obstetrics & Gynecology | Admitting: Obstetrics & Gynecology

## 2023-09-30 ENCOUNTER — Encounter (HOSPITAL_BASED_OUTPATIENT_CLINIC_OR_DEPARTMENT_OTHER): Payer: Self-pay | Admitting: Obstetrics & Gynecology

## 2023-09-30 VITALS — BP 120/78 | HR 79 | Ht 61.75 in | Wt 209.8 lb

## 2023-09-30 DIAGNOSIS — M069 Rheumatoid arthritis, unspecified: Secondary | ICD-10-CM | POA: Diagnosis not present

## 2023-09-30 DIAGNOSIS — Z124 Encounter for screening for malignant neoplasm of cervix: Secondary | ICD-10-CM

## 2023-09-30 DIAGNOSIS — E785 Hyperlipidemia, unspecified: Secondary | ICD-10-CM

## 2023-09-30 DIAGNOSIS — C73 Malignant neoplasm of thyroid gland: Secondary | ICD-10-CM

## 2023-09-30 DIAGNOSIS — Z01419 Encounter for gynecological examination (general) (routine) without abnormal findings: Secondary | ICD-10-CM

## 2023-09-30 DIAGNOSIS — Z975 Presence of (intrauterine) contraceptive device: Secondary | ICD-10-CM | POA: Diagnosis not present

## 2023-09-30 LAB — LIPID PANEL
Chol/HDL Ratio: 2.8 ratio (ref 0.0–4.4)
Cholesterol, Total: 214 mg/dL — ABNORMAL HIGH (ref 100–199)
HDL: 77 mg/dL (ref >39)
LDL Chol Calc (NIH): 117 mg/dL — ABNORMAL HIGH (ref 0–99)
Triglycerides: 117 mg/dL (ref 0–149)
VLDL Cholesterol Cal: 20 mg/dL (ref 5–40)

## 2023-09-30 LAB — HEMOGLOBIN A1C
Est. average glucose Bld gHb Est-mCnc: 111 mg/dL
Hgb A1c MFr Bld: 5.5 % (ref 4.8–5.6)

## 2023-09-30 NOTE — Progress Notes (Signed)
37 y.o. G0P0000 Single Asian female here for annual exam.  H/o thyroid cancer.  Saw endocrinologist at [facility] 09/06/2023.  Had lab work done that day.  Having issues with fatigue and temperature fluctuations.  Levothyroxine lowered to and T3 was added.  This has caused a lot of diarrhea for her.    Saw rheumatologist last week.  Will have follow up thyroid blood work in November when does infusion.    Patient's last menstrual period was 04/17/2023.          Sexually active: No.  The current method of family planning is IUD.    Smoker:  no  Health Maintenance: Pap:  09/03/2022 Negative History of abnormal Pap:  h/o HR HPV 2020 MMG:  guidelines reviewed Colonoscopy:  guidelines reviewed Screening Labs: does with endocrinologist and rheumatologist   reports that she has never smoked. She has never used smokeless tobacco. She reports current alcohol use of about 3.0 standard drinks of alcohol per week. She reports that she does not use drugs.  Past Medical History:  Diagnosis Date   Allergy    Cancer [facility]) August 2022   Papillary thyroid Cancer   Chronic headache    Clotting disorder [facility])    elementary age nose bleeds   Depression    Dysmenorrhea    Family history of diabetes mellitus    Generalized anxiety disorder    GERD (gastroesophageal reflux disease)    High risk medication use    Humira, PPI   Hypertension    Insomnia    Obesity    Rheumatoid arthritis (HCC) 10/2014   started Humira 04/2016; Dr. Zenovia Jordan   Seasonal allergies     Past Surgical History:  Procedure Laterality Date   ESOPHAGOGASTRODUODENOSCOPY  01/2010   [place], [place]   TOTAL THYROIDECTOMY  11/02/2021    Current Outpatient Medications  Medication Sig Dispense Refill   ALPRAZolam (XANAX) 0.5 MG tablet TAKE 1 TABLET BY MOUTH TWICE A DAY AS NEEDED FOR ANXIETY 60 tablet 0   APPLE CIDER VINEGAR PO Take by mouth.     Ascorbic Acid (VITAMIN C) 100 MG tablet Take 100 mg daily by mouth.      atenolol (TENORMIN) 50 MG tablet TAKE 1 TABLET BY MOUTH EVERY DAY 90 tablet 1   Biotin 29562 MCG TBDP Take 5,000 mg by mouth.      Cranberry 1000 MG CAPS Take by mouth.     hydroxychloroquine (PLAQUENIL) 200 MG tablet Take 2 tablets by mouth daily.     levonorgestrel (KYLEENA) 19.5 MG IUD by Intrauterine route once.     levothyroxine (SYNTHROID) 100 MCG tablet Take 100 mcg by mouth daily before breakfast.     liothyronine (CYTOMEL) 5 MCG tablet Take 5 mcg by mouth 2 (two) times daily.     losartan (COZAAR) 25 MG tablet TAKE 1 TABLET (25 MG TOTAL) BY MOUTH DAILY. 90 tablet 1   RESTASIS 0.05 % ophthalmic emulsion Place 1 drop into both eyes 2 (two) times daily. 1.5 mL 0   Tocilizumab (ACTEMRA) 200 MG/10ML SOLN Inject 1 application into the vein every 28 (twenty-eight) days.     traZODone (DESYREL) 50 MG tablet Take 1 tablet (50 mg total) by mouth at bedtime. 45 tablet 1   vitamin B-12 (CYANOCOBALAMIN) 100 MCG tablet Take 100 mcg daily by mouth.     vortioxetine HBr (TRINTELLIX) 20 MG TABS tablet Take 20 mg by mouth daily.     No current facility-administered medications for this visit.  Family History  Problem Relation Age of Onset   Diabetes Father    Hypertension Father    High Cholesterol Father    Obesity Father    Diabetes Mother    Hypertension Mother    High Cholesterol Mother    Arthritis Mother    Diabetes Sister    Hypertension Sister    Obesity Sister    Heart disease Maternal Uncle        MI   Heart disease Maternal Grandmother        MI   Heart disease Maternal Grandfather        MI   Cancer Neg Hx     ROS: Constitutional: negative Genitourinary:negative  Exam:   BP 120/78 (BP Location: Right Arm, Patient Position: Sitting, Cuff Size: Large)   Pulse 79   Ht 5' 1.75" (1.568 m)   Wt 209 lb 12.8 oz (95.2 kg)   LMP 04/17/2023 Comment: irregular  BMI 38.68 kg/m   Height: 5' 1.75" (156.8 cm)  General appearance: alert, cooperative and appears stated  age Head: Normocephalic, without obvious abnormality, atraumatic Neck: no adenopathy, supple, symmetrical, trachea midline and thyroid normal to inspection and palpation Lungs: clear to auscultation bilaterally Breasts: normal appearance, no masses or tenderness Heart: regular rate and rhythm Abdomen: soft, non-tender; bowel sounds normal; no masses,  no organomegaly Extremities: extremities normal, atraumatic, no cyanosis or edema Skin: Skin color, texture, turgor normal. No rashes or lesions Lymph nodes: Cervical, supraclavicular, and axillary nodes normal. No abnormal inguinal nodes palpated Neurologic: Grossly normal   Pelvic: External genitalia:  no lesions              Urethra:  normal appearing urethra with no masses, tenderness or lesions              Bartholins and Skenes: normal                 Vagina: normal appearing vagina with normal color and no discharge, no lesions              Cervix: no lesions              Pap taken: Yes.   Bimanual Exam:  Uterus:  normal size, contour, position, consistency, mobility, non-tender              Adnexa: normal adnexa and no mass, fullness, tenderness               Rectovaginal: Confirms               Anus:  normal sphincter tone, no lesions  Chaperone, Ina Homes, CMA, was present for exam.  Assessment/Plan: 1. Well woman exam with routine gynecological exam - Pap smear obtained today - Mammogram guidelines reviewed - Colonoscopy guidelines reviewed - lab work done - vaccines reviewed/updated  2. Cervical cancer screening  - Cytology - PAP( )  3. Thyroid cancer (HCC) - followed at [facility]  4. IUD (intrauterine device) in place - Kyleena placed 09/2019.  Will be due for removal/replacement next year  5. Rheumatoid arthritis, involving unspecified site, unspecified whether rheumatoid factor present [facility]) - followed by Dr. Nickola Major

## 2023-10-01 ENCOUNTER — Encounter (HOSPITAL_BASED_OUTPATIENT_CLINIC_OR_DEPARTMENT_OTHER): Payer: Self-pay | Admitting: Family Medicine

## 2023-10-01 ENCOUNTER — Other Ambulatory Visit (HOSPITAL_BASED_OUTPATIENT_CLINIC_OR_DEPARTMENT_OTHER): Payer: Self-pay

## 2023-10-01 DIAGNOSIS — I10 Essential (primary) hypertension: Secondary | ICD-10-CM

## 2023-10-01 MED ORDER — ATENOLOL 50 MG PO TABS
50.0000 mg | ORAL_TABLET | Freq: Every day | ORAL | 1 refills | Status: DC
Start: 2023-10-01 — End: 2023-12-31

## 2023-10-01 MED ORDER — LOSARTAN POTASSIUM 25 MG PO TABS: 25.0000 mg | ORAL_TABLET | Freq: Every day | ORAL | 1 refills | Status: DC

## 2023-10-04 LAB — CYTOLOGY - PAP
Adequacy: ABSENT
Diagnosis: NEGATIVE

## 2023-10-06 DIAGNOSIS — Z713 Dietary counseling and surveillance: Secondary | ICD-10-CM | POA: Diagnosis not present

## 2023-10-06 DIAGNOSIS — E669 Obesity, unspecified: Secondary | ICD-10-CM | POA: Diagnosis not present

## 2023-10-13 DIAGNOSIS — E669 Obesity, unspecified: Secondary | ICD-10-CM | POA: Diagnosis not present

## 2023-10-13 DIAGNOSIS — Z713 Dietary counseling and surveillance: Secondary | ICD-10-CM | POA: Diagnosis not present

## 2023-10-22 DIAGNOSIS — M0579 Rheumatoid arthritis with rheumatoid factor of multiple sites without organ or systems involvement: Secondary | ICD-10-CM | POA: Diagnosis not present

## 2023-10-22 DIAGNOSIS — Z79899 Other long term (current) drug therapy: Secondary | ICD-10-CM | POA: Diagnosis not present

## 2023-10-27 DIAGNOSIS — E669 Obesity, unspecified: Secondary | ICD-10-CM | POA: Diagnosis not present

## 2023-10-27 DIAGNOSIS — Z713 Dietary counseling and surveillance: Secondary | ICD-10-CM | POA: Diagnosis not present

## 2023-10-28 DIAGNOSIS — F4323 Adjustment disorder with mixed anxiety and depressed mood: Secondary | ICD-10-CM | POA: Diagnosis not present

## 2023-10-29 ENCOUNTER — Encounter (HOSPITAL_BASED_OUTPATIENT_CLINIC_OR_DEPARTMENT_OTHER): Payer: Self-pay | Admitting: Family Medicine

## 2023-10-29 ENCOUNTER — Encounter (HOSPITAL_BASED_OUTPATIENT_CLINIC_OR_DEPARTMENT_OTHER): Payer: Self-pay | Admitting: Obstetrics & Gynecology

## 2023-11-04 DIAGNOSIS — Z713 Dietary counseling and surveillance: Secondary | ICD-10-CM | POA: Diagnosis not present

## 2023-11-04 DIAGNOSIS — E669 Obesity, unspecified: Secondary | ICD-10-CM | POA: Diagnosis not present

## 2023-11-05 DIAGNOSIS — F4323 Adjustment disorder with mixed anxiety and depressed mood: Secondary | ICD-10-CM | POA: Diagnosis not present

## 2023-11-12 DIAGNOSIS — F4323 Adjustment disorder with mixed anxiety and depressed mood: Secondary | ICD-10-CM | POA: Diagnosis not present

## 2023-11-18 DIAGNOSIS — F4323 Adjustment disorder with mixed anxiety and depressed mood: Secondary | ICD-10-CM | POA: Diagnosis not present

## 2023-11-19 DIAGNOSIS — E890 Postprocedural hypothyroidism: Secondary | ICD-10-CM | POA: Diagnosis not present

## 2023-11-19 DIAGNOSIS — M0579 Rheumatoid arthritis with rheumatoid factor of multiple sites without organ or systems involvement: Secondary | ICD-10-CM | POA: Diagnosis not present

## 2023-11-20 ENCOUNTER — Ambulatory Visit (HOSPITAL_BASED_OUTPATIENT_CLINIC_OR_DEPARTMENT_OTHER): Payer: BC Managed Care – PPO | Admitting: Family Medicine

## 2023-11-20 VITALS — BP 141/95 | HR 74 | Ht 61.75 in | Wt 213.1 lb

## 2023-11-20 DIAGNOSIS — R051 Acute cough: Secondary | ICD-10-CM | POA: Diagnosis not present

## 2023-11-20 DIAGNOSIS — B9689 Other specified bacterial agents as the cause of diseases classified elsewhere: Secondary | ICD-10-CM

## 2023-11-20 DIAGNOSIS — J019 Acute sinusitis, unspecified: Secondary | ICD-10-CM

## 2023-11-20 DIAGNOSIS — R110 Nausea: Secondary | ICD-10-CM | POA: Diagnosis not present

## 2023-11-20 DIAGNOSIS — E669 Obesity, unspecified: Secondary | ICD-10-CM | POA: Diagnosis not present

## 2023-11-20 DIAGNOSIS — Z713 Dietary counseling and surveillance: Secondary | ICD-10-CM | POA: Diagnosis not present

## 2023-11-20 MED ORDER — HYDROCODONE BIT-HOMATROP MBR 5-1.5 MG/5ML PO SOLN: 5.0000 mL | Freq: Three times a day (TID) | ORAL | 0 refills | Status: DC | PRN

## 2023-11-20 MED ORDER — AMOXICILLIN-POT CLAVULANATE 875-125 MG PO TABS: 1.0000 | ORAL_TABLET | Freq: Two times a day (BID) | ORAL | 0 refills | Status: DC

## 2023-11-20 MED ORDER — ONDANSETRON 4 MG PO TBDP: 4.0000 mg | ORAL_TABLET | Freq: Three times a day (TID) | ORAL | 0 refills | Status: DC | PRN

## 2023-11-20 NOTE — Patient Instructions (Signed)
Non-Medication Therapy:  Drink plenty of fluids, warm if possible.   A teaspoon of honey may help ease coughing symptoms.   Cough drops or hard candy for coughing.   Over the Counter Medication Therapy:  Use a cough expectorant such as guaifenesin (Mucinex) if recommended by your doctor for a wet, congested cough. If you have high blood pressure, please ask your doctor first before using this.   Use a cough suppressant such as dextromethorphan (Robitussin/Delsym) for a dry cough. If you have high blood pressure, please ask your doctor first before using this.   If you have high blood pressure, medication such as Coricidin HBP is safe to take for your cough and will not increase your blood pressure.

## 2023-11-20 NOTE — Progress Notes (Unsigned)
   Acute Office Visit  Subjective:     Patient ID: Tamara Vega, female    DOB: 08/16/1986, 37 y.o.   MRN: [id number]  Chief Complaint  Patient presents with   Nausea    11/26 is when it started, fizzy water helps, ginger flavored sparkling water   Sinus Problem    Ongoing since Tuesday, the 10th. 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  Alyson Reedy, FNP

## 2023-11-28 ENCOUNTER — Other Ambulatory Visit (HOSPITAL_BASED_OUTPATIENT_CLINIC_OR_DEPARTMENT_OTHER): Payer: Self-pay | Admitting: Family Medicine

## 2023-11-28 ENCOUNTER — Encounter (HOSPITAL_BASED_OUTPATIENT_CLINIC_OR_DEPARTMENT_OTHER): Payer: Self-pay | Admitting: Family Medicine

## 2023-11-28 DIAGNOSIS — B3731 Acute candidiasis of vulva and vagina: Secondary | ICD-10-CM

## 2023-11-28 MED ORDER — FLUCONAZOLE 150 MG PO TABS: 150.0000 mg | ORAL_TABLET | Freq: Once | ORAL | 0 refills | Status: AC

## 2023-11-28 NOTE — Telephone Encounter (Signed)
Alexis, please see mychart message sent by pt and advise if you are okay sending in diflucan for pt to help with the yeast infection she has from abx or if you want Foye Clock to handle this.

## 2023-12-02 ENCOUNTER — Other Ambulatory Visit (HOSPITAL_BASED_OUTPATIENT_CLINIC_OR_DEPARTMENT_OTHER): Payer: BC Managed Care – PPO

## 2023-12-02 DIAGNOSIS — Z0000 Encounter for general adult medical examination without abnormal findings: Secondary | ICD-10-CM

## 2023-12-03 LAB — COMPREHENSIVE METABOLIC PANEL
ALT: 40 [IU]/L — ABNORMAL HIGH (ref 0–32)
AST: 31 [IU]/L (ref 0–40)
Albumin: 4.6 g/dL (ref 3.9–4.9)
Alkaline Phosphatase: 53 [IU]/L (ref 44–121)
BUN/Creatinine Ratio: 14 (ref 9–23)
BUN: 9 mg/dL (ref 6–20)
Bilirubin Total: 0.4 mg/dL (ref 0.0–1.2)
CO2: 23 mmol/L (ref 20–29)
Calcium: 9.5 mg/dL (ref 8.7–10.2)
Chloride: 100 mmol/L (ref 96–106)
Creatinine, Ser: 0.65 mg/dL (ref 0.57–1.00)
Globulin, Total: 2.5 g/dL (ref 1.5–4.5)
Glucose: 99 mg/dL (ref 70–99)
Potassium: 4.3 mmol/L (ref 3.5–5.2)
Sodium: 138 mmol/L (ref 134–144)
Total Protein: 7.1 g/dL (ref 6.0–8.5)
eGFR: 115 mL/min/{1.73_m2} (ref >59)

## 2023-12-03 LAB — CBC WITH DIFFERENTIAL/PLATELET
Basophils Absolute: 0.1 10*3/uL (ref 0.0–0.2)
Basos: 1 %
EOS (ABSOLUTE): 0.4 10*3/uL (ref 0.0–0.4)
Eos: 5 %
Hematocrit: 45.5 % (ref 34.0–46.6)
Hemoglobin: 15.3 g/dL (ref 11.1–15.9)
Immature Grans (Abs): 0 10*3/uL (ref 0.0–0.1)
Immature Granulocytes: 0 %
Lymphocytes Absolute: 2.8 10*3/uL (ref 0.7–3.1)
Lymphs: 41 %
MCH: 30.1 pg (ref 26.6–33.0)
MCHC: 33.6 g/dL (ref 31.5–35.7)
MCV: 89 fL (ref 79–97)
Monocytes Absolute: 0.5 10*3/uL (ref 0.1–0.9)
Monocytes: 7 %
Neutrophils Absolute: 3.1 10*3/uL (ref 1.4–7.0)
Neutrophils: 46 %
Platelets: 319 10*3/uL (ref 150–450)
RBC: 5.09 x10E6/uL (ref 3.77–5.28)
RDW: 11.5 % — ABNORMAL LOW (ref 11.7–15.4)
WBC: 6.8 10*3/uL (ref 3.4–10.8)

## 2023-12-03 LAB — LIPID PANEL
Chol/HDL Ratio: 2.7 {ratio} (ref 0.0–4.4)
Cholesterol, Total: 232 mg/dL — ABNORMAL HIGH (ref 100–199)
HDL: 86 mg/dL (ref >39)
LDL Chol Calc (NIH): 129 mg/dL — ABNORMAL HIGH (ref 0–99)
Triglycerides: 99 mg/dL (ref 0–149)
VLDL Cholesterol Cal: 17 mg/dL (ref 5–40)

## 2023-12-03 LAB — HEMOGLOBIN A1C
Est. average glucose Bld gHb Est-mCnc: 111 mg/dL
Hgb A1c MFr Bld: 5.5 % (ref 4.8–5.6)

## 2023-12-03 LAB — TSH RFX ON ABNORMAL TO FREE T4: TSH: 0.955 u[IU]/mL (ref 0.450–4.500)

## 2023-12-08 DIAGNOSIS — Z713 Dietary counseling and surveillance: Secondary | ICD-10-CM | POA: Diagnosis not present

## 2023-12-08 DIAGNOSIS — E669 Obesity, unspecified: Secondary | ICD-10-CM | POA: Diagnosis not present

## 2023-12-10 DIAGNOSIS — F4323 Adjustment disorder with mixed anxiety and depressed mood: Secondary | ICD-10-CM | POA: Diagnosis not present

## 2023-12-13 ENCOUNTER — Encounter (HOSPITAL_BASED_OUTPATIENT_CLINIC_OR_DEPARTMENT_OTHER): Payer: Self-pay | Admitting: Family Medicine

## 2023-12-13 ENCOUNTER — Ambulatory Visit (HOSPITAL_BASED_OUTPATIENT_CLINIC_OR_DEPARTMENT_OTHER): Payer: BC Managed Care – PPO | Admitting: Family Medicine

## 2023-12-13 VITALS — BP 118/78 | HR 87 | Ht 62.5 in | Wt 209.4 lb

## 2023-12-13 DIAGNOSIS — R040 Epistaxis: Secondary | ICD-10-CM

## 2023-12-13 DIAGNOSIS — Z0000 Encounter for general adult medical examination without abnormal findings: Secondary | ICD-10-CM

## 2023-12-13 NOTE — Progress Notes (Signed)
 Subjective:    CC: Annual Physical Exam  HPI: Tamara Vega is a 38 y.o. presenting for annual physical  I reviewed the past medical history, family history, social history, surgical history, and allergies today and no changes were needed.  Please see the problem list section below in epic for further details.  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 Jon Jacob   Seasonal allergies    Past Surgical History: Past Surgical History:  Procedure Laterality Date   ESOPHAGOGASTRODUODENOSCOPY  01/2010   [place]   TOTAL THYROIDECTOMY  11/02/2021   wisdom teeth removal  06/03/2023   Social History: Social History   Socioeconomic History   Marital status: Single    Spouse name: Not on file   Number of children: Not on file   Years of education: Not on file   Highest education level: Professional school degree (e.g., MD, DDS, DVM, JD)  Occupational History   Not on file  Tobacco Use   Smoking status: Never   Smokeless tobacco: Never  Vaping Use   Vaping status: Never Used  Substance and Sexual Activity   Alcohol use: Yes    Alcohol/week: 3.0 standard drinks of alcohol    Types: 2 Glasses of wine, 1 Standard drinks or equivalent per week   Drug use: No   Sexual activity: Not Currently    Birth control/protection: Coitus interruptus, Condom, I.U.D.  Other Topics Concern   Not on file  Social History Narrative   Single, attorney, self employed 2019, was formerly at [employer].   Drinks about 3L water daily.    Started Weight Watchers 11/2018.   Joined [employer] 12/2018, 4 times per week, doing facebook live zoom workouts, also doing long walks 6-10 mile walks.  No significant other currently. 06/2020   Social Drivers of Health   Financial Resource Strain: Low Risk  (11/20/2023)   Overall Financial Resource Strain (CARDIA)    Difficulty of Paying Living Expenses: Not hard at all  Food Insecurity: No Food Insecurity (11/20/2023)   Hunger Vital Sign    Worried About Running Out of Food in the Last Year: Never true    Ran Out of Food in the Last Year: Never true  Transportation Needs: No Transportation Needs (11/20/2023)   PRAPARE - Administrator, Civil Service (Medical): No    Lack of Transportation (Non-Medical): No  Physical Activity: Sufficiently Active (11/20/2023)   Exercise Vital Sign    Days of Exercise per Week: 6 days    Minutes of Exercise per Session: 40 min  Stress: Stress Concern Present (11/20/2023)   [employer] of Occupational Health - Occupational Stress Questionnaire    Feeling of Stress : To some extent  Social Connections: Moderately Isolated (11/20/2023)   Social Connection and Isolation Panel [NHANES]    Frequency of Communication with Friends and Family: More than three times a week    Frequency  of Social Gatherings with Friends and Family: Once a week    Attends Religious Services: Never    Database Administrator or Organizations: Yes    Attends Engineer, Structural: More than 4 times per year    Marital Status: Never married   Family History: Family History  Problem Relation Age of Onset   Diabetes Father    Hypertension Father    High Cholesterol Father    Obesity Father    Diabetes Mother    Hypertension Mother    High Cholesterol Mother    Arthritis Mother    Diabetes Sister    Hypertension Sister    Obesity Sister    Heart disease Maternal Uncle        MI   Heart disease Maternal Grandmother        MI   Heart disease  Maternal Grandfather        MI   Cancer Neg Hx    Allergies: Allergies  Allergen Reactions   Bactrim [Sulfamethoxazole-Trimethoprim] Swelling    reddness and itching    Diclofenac     Blood in stool once 2016   Tdvax [Tetanus-Diphtheria Toxoids Td]     Td vaccine - swollen red painful injection site, fatigue, headache   Medications: See med rec.  Review of Systems: No headache, visual changes, nausea, vomiting, diarrhea, constipation, dizziness, abdominal pain, skin rash, fevers, chills, night sweats, swollen lymph nodes, weight loss, chest pain, body aches, joint swelling, muscle aches, shortness of breath, mood changes, visual or auditory hallucinations.  Objective:    BP 118/78 (BP Location: Right Arm, Patient Position: Sitting, Cuff Size: Normal)   Pulse 87   Ht 5' 2.5 (1.588 m)   Wt 209 lb 6.4 oz (95 kg)   SpO2 98%   BMI 37.69 kg/m   General: Well Developed, well nourished, and in no acute distress. Neuro: Alert and oriented x3, extra-ocular muscles intact, sensation grossly intact. Cranial nerves II through XII are intact, motor, sensory, and coordinative functions are all intact. HEENT: Normocephalic, atraumatic, pupils equal round reactive to light, neck supple, no masses, no lymphadenopathy, thyroid  nonpalpable. Oropharynx, nasopharynx, external ear canals are unremarkable. Skin: Warm and dry, no rashes noted. Cardiac: Regular rate and rhythm, no murmurs rubs or gallops. Respiratory: Clear to auscultation bilaterally. Not using accessory muscles, speaking in full sentences. Abdominal: Soft, nontender, nondistended, positive bowel sounds, no masses, no organomegaly. Musculoskeletal: Shoulder, elbow, wrist, hip, knee, ankle stable, and with full range of motion.  Impression and Recommendations:    Wellness examination Assessment & Plan: Routine HCM labs reviewed. HCM reviewed/discussed. Anticipatory guidance regarding healthy weight, lifestyle and choices  given. Recommend healthy diet.  Recommend approximately 150 minutes/week of moderate intensity exercise Recommend regular dental and vision exams Always use seatbelt/lap and shoulder restraints Recommend using smoke alarms and checking batteries at least twice a year Recommend using sunscreen when outside Discussed tetanus immunization recommendations, patient is UTD   Epistaxis Assessment & Plan: Has had recent issues with sinus congestion, sinusitis. More recently had nose bleed last 28 minutes. History of nose bleeds when younger - middle school age through early teens Still with sinus pressure. No fever. On exam, bilateral turbinates with scant dried blood noted, no active bleeding at this time, no defects in mucosal. Discussed options with patient today including monitoring, referral to ENT, conservative measures such as nasal saline spray, humidifier.  At this time, patient would prefer to continue with monitoring and she will need to utilize  humidifier and nasal saline spray.  Discussed that if symptoms do persist, can proceed with referral to ENT, she will keep us  updated regarding this.   Return in about 1 year (around 12/12/2024) for CPE.   ___________________________________________ Zymier Rodgers de [place], MD, ABFM, [facility]

## 2023-12-13 NOTE — Assessment & Plan Note (Signed)
 Routine HCM labs reviewed. HCM reviewed/discussed. Anticipatory guidance regarding healthy weight, lifestyle and choices given. Recommend healthy diet.  Recommend approximately 150 minutes/week of moderate intensity exercise Recommend regular dental and vision exams Always use seatbelt/lap and shoulder restraints Recommend using smoke alarms and checking batteries at least twice a year Recommend using sunscreen when outside Discussed tetanus immunization recommendations, patient is UTD

## 2023-12-13 NOTE — Assessment & Plan Note (Addendum)
 Has had recent issues with sinus congestion, sinusitis. More recently had nose bleed last 28 minutes. History of nose bleeds when younger - middle school age through early teens Still with sinus pressure. No fever. On exam, bilateral turbinates with scant dried blood noted, no active bleeding at this time, no defects in mucosal. Discussed options with patient today including monitoring, referral to ENT, conservative measures such as nasal saline spray, humidifier.  At this time, patient would prefer to continue with monitoring and she will need to utilize humidifier and nasal saline spray.  Discussed that if symptoms do persist, can proceed with referral to ENT, she will keep us  updated regarding this.

## 2023-12-13 NOTE — Patient Instructions (Signed)
  Medication Instructions:  Your physician recommends that you continue on your current medications as directed. Please refer to the Current Medication list given to you today. --If you need a refill on any your medications before your next appointment, please call your pharmacy first. If no refills are authorized on file call the office.--   Follow-Up: Your next appointment:   Your physician recommends that you schedule a follow-up appointment in: 1 year for Yearly Physical with Dr. de Cuba  You will receive a text message or e-mail with a link to a survey about your care and experience with us  today! We would greatly appreciate your feedback!   Thanks for letting us  be apart of your health journey!!  Primary Care and Sports Medicine   Dr. Quintin sheerer Cuba   We encourage you to activate your patient portal called MyChart.  Sign up information is provided on this After Visit Summary.  MyChart is used to connect with patients for Virtual Visits (Telemedicine).  Patients are able to view lab/test results, encounter notes, upcoming appointments, etc.  Non-urgent messages can be sent to your provider as well. To learn more about what you can do with MyChart, please visit --  [email or web address].

## 2023-12-16 DIAGNOSIS — F4323 Adjustment disorder with mixed anxiety and depressed mood: Secondary | ICD-10-CM | POA: Diagnosis not present

## 2023-12-22 DIAGNOSIS — Z713 Dietary counseling and surveillance: Secondary | ICD-10-CM | POA: Diagnosis not present

## 2023-12-22 DIAGNOSIS — E669 Obesity, unspecified: Secondary | ICD-10-CM | POA: Diagnosis not present

## 2023-12-24 DIAGNOSIS — F4323 Adjustment disorder with mixed anxiety and depressed mood: Secondary | ICD-10-CM | POA: Diagnosis not present

## 2023-12-30 ENCOUNTER — Other Ambulatory Visit (HOSPITAL_BASED_OUTPATIENT_CLINIC_OR_DEPARTMENT_OTHER): Payer: Self-pay | Admitting: Family Medicine

## 2023-12-30 DIAGNOSIS — I10 Essential (primary) hypertension: Secondary | ICD-10-CM

## 2023-12-30 DIAGNOSIS — Z79899 Other long term (current) drug therapy: Secondary | ICD-10-CM | POA: Diagnosis not present

## 2023-12-31 DIAGNOSIS — Z79899 Other long term (current) drug therapy: Secondary | ICD-10-CM | POA: Diagnosis not present

## 2023-12-31 DIAGNOSIS — M79641 Pain in right hand: Secondary | ICD-10-CM | POA: Diagnosis not present

## 2023-12-31 DIAGNOSIS — M0579 Rheumatoid arthritis with rheumatoid factor of multiple sites without organ or systems involvement: Secondary | ICD-10-CM | POA: Diagnosis not present

## 2023-12-31 DIAGNOSIS — M79642 Pain in left hand: Secondary | ICD-10-CM | POA: Diagnosis not present

## 2024-01-06 DIAGNOSIS — F4323 Adjustment disorder with mixed anxiety and depressed mood: Secondary | ICD-10-CM | POA: Diagnosis not present

## 2024-01-08 DIAGNOSIS — Z713 Dietary counseling and surveillance: Secondary | ICD-10-CM | POA: Diagnosis not present

## 2024-01-08 DIAGNOSIS — E669 Obesity, unspecified: Secondary | ICD-10-CM | POA: Diagnosis not present

## 2024-01-13 DIAGNOSIS — F4323 Adjustment disorder with mixed anxiety and depressed mood: Secondary | ICD-10-CM | POA: Diagnosis not present

## 2024-01-23 DIAGNOSIS — M0579 Rheumatoid arthritis with rheumatoid factor of multiple sites without organ or systems involvement: Secondary | ICD-10-CM | POA: Diagnosis not present

## 2024-01-26 DIAGNOSIS — E669 Obesity, unspecified: Secondary | ICD-10-CM | POA: Diagnosis not present

## 2024-01-26 DIAGNOSIS — Z713 Dietary counseling and surveillance: Secondary | ICD-10-CM | POA: Diagnosis not present

## 2024-01-27 DIAGNOSIS — L821 Other seborrheic keratosis: Secondary | ICD-10-CM | POA: Diagnosis not present

## 2024-01-27 DIAGNOSIS — L814 Other melanin hyperpigmentation: Secondary | ICD-10-CM | POA: Diagnosis not present

## 2024-01-27 DIAGNOSIS — L578 Other skin changes due to chronic exposure to nonionizing radiation: Secondary | ICD-10-CM | POA: Diagnosis not present

## 2024-01-27 DIAGNOSIS — D229 Melanocytic nevi, unspecified: Secondary | ICD-10-CM | POA: Diagnosis not present

## 2024-01-28 DIAGNOSIS — F4323 Adjustment disorder with mixed anxiety and depressed mood: Secondary | ICD-10-CM | POA: Diagnosis not present

## 2024-01-29 DIAGNOSIS — M0579 Rheumatoid arthritis with rheumatoid factor of multiple sites without organ or systems involvement: Secondary | ICD-10-CM | POA: Diagnosis not present

## 2024-01-29 DIAGNOSIS — Z111 Encounter for screening for respiratory tuberculosis: Secondary | ICD-10-CM | POA: Diagnosis not present

## 2024-01-29 DIAGNOSIS — R5383 Other fatigue: Secondary | ICD-10-CM | POA: Diagnosis not present

## 2024-02-11 DIAGNOSIS — F4323 Adjustment disorder with mixed anxiety and depressed mood: Secondary | ICD-10-CM | POA: Diagnosis not present

## 2024-02-16 DIAGNOSIS — Z713 Dietary counseling and surveillance: Secondary | ICD-10-CM | POA: Diagnosis not present

## 2024-02-16 DIAGNOSIS — E669 Obesity, unspecified: Secondary | ICD-10-CM | POA: Diagnosis not present

## 2024-02-17 DIAGNOSIS — F4323 Adjustment disorder with mixed anxiety and depressed mood: Secondary | ICD-10-CM | POA: Diagnosis not present

## 2024-02-19 DIAGNOSIS — M0579 Rheumatoid arthritis with rheumatoid factor of multiple sites without organ or systems involvement: Secondary | ICD-10-CM | POA: Diagnosis not present

## 2024-02-25 DIAGNOSIS — F4323 Adjustment disorder with mixed anxiety and depressed mood: Secondary | ICD-10-CM | POA: Diagnosis not present

## 2024-02-26 DIAGNOSIS — M0579 Rheumatoid arthritis with rheumatoid factor of multiple sites without organ or systems involvement: Secondary | ICD-10-CM | POA: Diagnosis not present

## 2024-03-03 DIAGNOSIS — F4323 Adjustment disorder with mixed anxiety and depressed mood: Secondary | ICD-10-CM | POA: Diagnosis not present

## 2024-03-04 DIAGNOSIS — F411 Generalized anxiety disorder: Secondary | ICD-10-CM | POA: Diagnosis not present

## 2024-03-04 DIAGNOSIS — F5101 Primary insomnia: Secondary | ICD-10-CM | POA: Diagnosis not present

## 2024-03-04 DIAGNOSIS — F410 Panic disorder [episodic paroxysmal anxiety] without agoraphobia: Secondary | ICD-10-CM | POA: Diagnosis not present

## 2024-03-06 DIAGNOSIS — E890 Postprocedural hypothyroidism: Secondary | ICD-10-CM | POA: Diagnosis not present

## 2024-03-06 DIAGNOSIS — C73 Malignant neoplasm of thyroid gland: Secondary | ICD-10-CM | POA: Diagnosis not present

## 2024-03-08 DIAGNOSIS — E669 Obesity, unspecified: Secondary | ICD-10-CM | POA: Diagnosis not present

## 2024-03-08 DIAGNOSIS — Z713 Dietary counseling and surveillance: Secondary | ICD-10-CM | POA: Diagnosis not present

## 2024-03-16 DIAGNOSIS — M0579 Rheumatoid arthritis with rheumatoid factor of multiple sites without organ or systems involvement: Secondary | ICD-10-CM | POA: Diagnosis not present

## 2024-03-23 DIAGNOSIS — F4323 Adjustment disorder with mixed anxiety and depressed mood: Secondary | ICD-10-CM | POA: Diagnosis not present

## 2024-03-29 DIAGNOSIS — Z713 Dietary counseling and surveillance: Secondary | ICD-10-CM | POA: Diagnosis not present

## 2024-03-29 DIAGNOSIS — E669 Obesity, unspecified: Secondary | ICD-10-CM | POA: Diagnosis not present

## 2024-03-30 DIAGNOSIS — M0579 Rheumatoid arthritis with rheumatoid factor of multiple sites without organ or systems involvement: Secondary | ICD-10-CM | POA: Diagnosis not present

## 2024-04-01 DIAGNOSIS — R5383 Other fatigue: Secondary | ICD-10-CM | POA: Diagnosis not present

## 2024-04-01 DIAGNOSIS — Z79899 Other long term (current) drug therapy: Secondary | ICD-10-CM | POA: Diagnosis not present

## 2024-04-01 DIAGNOSIS — M0579 Rheumatoid arthritis with rheumatoid factor of multiple sites without organ or systems involvement: Secondary | ICD-10-CM | POA: Diagnosis not present

## 2024-04-02 LAB — LAB REPORT - SCANNED: EGFR: 82

## 2024-04-13 DIAGNOSIS — M0579 Rheumatoid arthritis with rheumatoid factor of multiple sites without organ or systems involvement: Secondary | ICD-10-CM | POA: Diagnosis not present

## 2024-04-20 DIAGNOSIS — F4323 Adjustment disorder with mixed anxiety and depressed mood: Secondary | ICD-10-CM | POA: Diagnosis not present

## 2024-04-28 DIAGNOSIS — M0579 Rheumatoid arthritis with rheumatoid factor of multiple sites without organ or systems involvement: Secondary | ICD-10-CM | POA: Diagnosis not present

## 2024-04-28 DIAGNOSIS — F4323 Adjustment disorder with mixed anxiety and depressed mood: Secondary | ICD-10-CM | POA: Diagnosis not present

## 2024-05-02 DIAGNOSIS — E669 Obesity, unspecified: Secondary | ICD-10-CM | POA: Diagnosis not present

## 2024-05-02 DIAGNOSIS — Z713 Dietary counseling and surveillance: Secondary | ICD-10-CM | POA: Diagnosis not present

## 2024-05-04 DIAGNOSIS — F4323 Adjustment disorder with mixed anxiety and depressed mood: Secondary | ICD-10-CM | POA: Diagnosis not present

## 2024-05-11 DIAGNOSIS — F4323 Adjustment disorder with mixed anxiety and depressed mood: Secondary | ICD-10-CM | POA: Diagnosis not present

## 2024-05-26 DIAGNOSIS — F4323 Adjustment disorder with mixed anxiety and depressed mood: Secondary | ICD-10-CM | POA: Diagnosis not present

## 2024-05-26 DIAGNOSIS — M0579 Rheumatoid arthritis with rheumatoid factor of multiple sites without organ or systems involvement: Secondary | ICD-10-CM | POA: Diagnosis not present

## 2024-06-02 DIAGNOSIS — F4323 Adjustment disorder with mixed anxiety and depressed mood: Secondary | ICD-10-CM | POA: Diagnosis not present

## 2024-06-09 DIAGNOSIS — F4323 Adjustment disorder with mixed anxiety and depressed mood: Secondary | ICD-10-CM | POA: Diagnosis not present

## 2024-06-15 DIAGNOSIS — F4323 Adjustment disorder with mixed anxiety and depressed mood: Secondary | ICD-10-CM | POA: Diagnosis not present

## 2024-06-23 DIAGNOSIS — M0579 Rheumatoid arthritis with rheumatoid factor of multiple sites without organ or systems involvement: Secondary | ICD-10-CM | POA: Diagnosis not present

## 2024-06-23 DIAGNOSIS — Z79899 Other long term (current) drug therapy: Secondary | ICD-10-CM | POA: Diagnosis not present

## 2024-06-30 DIAGNOSIS — F4323 Adjustment disorder with mixed anxiety and depressed mood: Secondary | ICD-10-CM | POA: Diagnosis not present

## 2024-07-07 ENCOUNTER — Encounter (HOSPITAL_BASED_OUTPATIENT_CLINIC_OR_DEPARTMENT_OTHER): Payer: Self-pay | Admitting: Obstetrics & Gynecology

## 2024-07-07 DIAGNOSIS — F4323 Adjustment disorder with mixed anxiety and depressed mood: Secondary | ICD-10-CM | POA: Diagnosis not present

## 2024-07-14 DIAGNOSIS — F4323 Adjustment disorder with mixed anxiety and depressed mood: Secondary | ICD-10-CM | POA: Diagnosis not present

## 2024-07-21 DIAGNOSIS — M0579 Rheumatoid arthritis with rheumatoid factor of multiple sites without organ or systems involvement: Secondary | ICD-10-CM | POA: Diagnosis not present

## 2024-07-22 ENCOUNTER — Other Ambulatory Visit (HOSPITAL_BASED_OUTPATIENT_CLINIC_OR_DEPARTMENT_OTHER): Payer: Self-pay | Admitting: Obstetrics & Gynecology

## 2024-07-22 MED ORDER — MISOPROSTOL 200 MCG PO TABS
ORAL_TABLET | ORAL | 0 refills | Status: DC
Start: 2024-07-22 — End: 2024-10-07

## 2024-07-24 DIAGNOSIS — S0990XA Unspecified injury of head, initial encounter: Secondary | ICD-10-CM | POA: Diagnosis not present

## 2024-07-24 DIAGNOSIS — W208XXA Other cause of strike by thrown, projected or falling object, initial encounter: Secondary | ICD-10-CM | POA: Diagnosis not present

## 2024-07-28 DIAGNOSIS — F4323 Adjustment disorder with mixed anxiety and depressed mood: Secondary | ICD-10-CM | POA: Diagnosis not present

## 2024-07-30 ENCOUNTER — Encounter (HOSPITAL_BASED_OUTPATIENT_CLINIC_OR_DEPARTMENT_OTHER): Payer: Self-pay

## 2024-07-30 DIAGNOSIS — M0579 Rheumatoid arthritis with rheumatoid factor of multiple sites without organ or systems involvement: Secondary | ICD-10-CM | POA: Diagnosis not present

## 2024-07-30 DIAGNOSIS — Z79899 Other long term (current) drug therapy: Secondary | ICD-10-CM | POA: Diagnosis not present

## 2024-07-30 DIAGNOSIS — R5383 Other fatigue: Secondary | ICD-10-CM | POA: Diagnosis not present

## 2024-08-04 DIAGNOSIS — F4323 Adjustment disorder with mixed anxiety and depressed mood: Secondary | ICD-10-CM | POA: Diagnosis not present

## 2024-08-18 DIAGNOSIS — M0579 Rheumatoid arthritis with rheumatoid factor of multiple sites without organ or systems involvement: Secondary | ICD-10-CM | POA: Diagnosis not present

## 2024-08-20 DIAGNOSIS — F4323 Adjustment disorder with mixed anxiety and depressed mood: Secondary | ICD-10-CM | POA: Diagnosis not present

## 2024-08-25 DIAGNOSIS — F4323 Adjustment disorder with mixed anxiety and depressed mood: Secondary | ICD-10-CM | POA: Diagnosis not present

## 2024-09-01 DIAGNOSIS — F4323 Adjustment disorder with mixed anxiety and depressed mood: Secondary | ICD-10-CM | POA: Diagnosis not present

## 2024-09-02 DIAGNOSIS — F411 Generalized anxiety disorder: Secondary | ICD-10-CM | POA: Diagnosis not present

## 2024-09-02 DIAGNOSIS — F5101 Primary insomnia: Secondary | ICD-10-CM | POA: Diagnosis not present

## 2024-09-02 DIAGNOSIS — F410 Panic disorder [episodic paroxysmal anxiety] without agoraphobia: Secondary | ICD-10-CM | POA: Diagnosis not present

## 2024-09-08 DIAGNOSIS — F4323 Adjustment disorder with mixed anxiety and depressed mood: Secondary | ICD-10-CM | POA: Diagnosis not present

## 2024-09-11 DIAGNOSIS — Z9089 Acquired absence of other organs: Secondary | ICD-10-CM | POA: Diagnosis not present

## 2024-09-11 DIAGNOSIS — C73 Malignant neoplasm of thyroid gland: Secondary | ICD-10-CM | POA: Diagnosis not present

## 2024-09-11 DIAGNOSIS — E890 Postprocedural hypothyroidism: Secondary | ICD-10-CM | POA: Diagnosis not present

## 2024-09-16 DIAGNOSIS — M0579 Rheumatoid arthritis with rheumatoid factor of multiple sites without organ or systems involvement: Secondary | ICD-10-CM | POA: Diagnosis not present

## 2024-09-16 DIAGNOSIS — Z79899 Other long term (current) drug therapy: Secondary | ICD-10-CM | POA: Diagnosis not present

## 2024-09-16 DIAGNOSIS — F4323 Adjustment disorder with mixed anxiety and depressed mood: Secondary | ICD-10-CM | POA: Diagnosis not present

## 2024-09-21 DIAGNOSIS — F4323 Adjustment disorder with mixed anxiety and depressed mood: Secondary | ICD-10-CM | POA: Diagnosis not present

## 2024-09-23 ENCOUNTER — Other Ambulatory Visit (HOSPITAL_BASED_OUTPATIENT_CLINIC_OR_DEPARTMENT_OTHER): Payer: Self-pay | Admitting: Family Medicine

## 2024-09-23 DIAGNOSIS — I10 Essential (primary) hypertension: Secondary | ICD-10-CM

## 2024-09-28 DIAGNOSIS — F4323 Adjustment disorder with mixed anxiety and depressed mood: Secondary | ICD-10-CM | POA: Diagnosis not present

## 2024-10-01 ENCOUNTER — Ambulatory Visit (HOSPITAL_BASED_OUTPATIENT_CLINIC_OR_DEPARTMENT_OTHER): Payer: BC Managed Care – PPO | Admitting: Obstetrics & Gynecology

## 2024-10-06 DIAGNOSIS — F4323 Adjustment disorder with mixed anxiety and depressed mood: Secondary | ICD-10-CM | POA: Diagnosis not present

## 2024-10-07 ENCOUNTER — Ambulatory Visit (HOSPITAL_BASED_OUTPATIENT_CLINIC_OR_DEPARTMENT_OTHER): Admitting: Obstetrics & Gynecology

## 2024-10-07 ENCOUNTER — Encounter (HOSPITAL_BASED_OUTPATIENT_CLINIC_OR_DEPARTMENT_OTHER): Payer: Self-pay | Admitting: Obstetrics & Gynecology

## 2024-10-07 ENCOUNTER — Other Ambulatory Visit (HOSPITAL_COMMUNITY)
Admission: RE | Admit: 2024-10-07 | Discharge: 2024-10-07 | Disposition: A | Discharge status: Home / Self Care | Source: Ambulatory Visit | Attending: Obstetrics & Gynecology | Admitting: Obstetrics & Gynecology

## 2024-10-07 VITALS — BP 106/75 | HR 73 | Ht 62.5 in | Wt 177.0 lb

## 2024-10-07 DIAGNOSIS — Z124 Encounter for screening for malignant neoplasm of cervix: Secondary | ICD-10-CM

## 2024-10-07 DIAGNOSIS — Z01419 Encounter for gynecological examination (general) (routine) without abnormal findings: Secondary | ICD-10-CM

## 2024-10-07 DIAGNOSIS — Z1231 Encounter for screening mammogram for malignant neoplasm of breast: Secondary | ICD-10-CM

## 2024-10-07 DIAGNOSIS — Z30433 Encounter for removal and reinsertion of intrauterine contraceptive device: Secondary | ICD-10-CM

## 2024-10-07 MED ORDER — LEVONORGESTREL 19.5 MG IU IUD: INTRAUTERINE_SYSTEM | Freq: Once | INTRAUTERINE | Status: AC

## 2024-10-07 NOTE — Progress Notes (Signed)
 ANNUAL EXAM Patient name: Tamara Vega MRN [id number]  Date of birth: 06-05-1986 Chief Complaint:   Gynecologic Exam  History of Present Illness:   Tamara Vega is a 38 y.o. G0P0000 female being seen today for a routine annual exam. Patient is here today for Kyleena  IUD removal and replacement.  Last bleeding episode was May.  This was very light.  Used only a liner.  Needs removal and replacement today.     Patient's last menstrual period was 04/13/2024 (approximate).  Last pap 09/30/2023. Results were: NILM w/ HRHPV not done. H/O abnormal pap: yes Last mammogram: Family h/o breast cancer: no Last colonoscopy:  Family h/o colorectal cancer: no.  Guidelines reviewed.       10/07/2024    3:17 PM 12/13/2023   10:46 AM 09/30/2023    9:09 AM 05/09/2023    4:06 PM 10/19/2022   10:51 AM  Depression screen PHQ 2/9  Decreased Interest 1 0 0 1 0  Down, Depressed, Hopeless 0 2 0 1 0  PHQ - 2 Score 1 2 0 2 0  Altered sleeping 2 2  2  0  Tired, decreased energy 2 3  2  0  Change in appetite 0 0  1 0  Feeling bad or failure about yourself  0 0  1 0  Trouble concentrating 0 0  0 0  Moving slowly or fidgety/restless 0 0  0 0  Suicidal thoughts 0 0  0 0  PHQ-9 Score 5  7   8   0   Difficult doing work/chores Not difficult at all Somewhat difficult  Not difficult at all Not difficult at all     Data saved with a previous flowsheet row definition        12/13/2023   10:47 AM 05/09/2023    4:07 PM 10/19/2022   10:51 AM  GAD 7 : Generalized Anxiety Score  Nervous, Anxious, on Edge 2 1 0  Control/stop worrying 2 1 0  Worry too much - different things 2 1 0  Trouble relaxing 3 2 0  Restless 0 0 0  Easily annoyed or irritable 1 1 0  Afraid - awful might happen 0 1 0  Total GAD 7 Score 10 7 0  Anxiety Difficulty Somewhat difficult Not difficult at all Not difficult at all     Review of Systems:   Pertinent items are noted in HPI Denies any headaches, blurred vision, fatigue, shortness of  breath, chest pain, abdominal pain, abnormal vaginal discharge/itching/odor/irritation, problems with periods, bowel movements, urination, or intercourse unless otherwise stated above. Pertinent History Reviewed:  Reviewed past medical,surgical, social and family history.  Reviewed problem list, medications and allergies. Physical Assessment:   Vitals:   10/07/24 1507  BP: 106/75  Pulse: 73  Weight: 177 lb (80.3 kg)  Height: 5' 2.5 (1.588 m)  Body mass index is 31.86 kg/m.        Physical Examination:   General appearance - well appearing, and in no distress  Mental status - alert, oriented to person, place, and time  Psych:  She has a normal mood and affect  Skin - warm and dry, normal color, no suspicious lesions noted  Chest - effort normal, all lung fields clear to auscultation bilaterally  Heart - normal rate and regular rhythm  Neck:  midline trachea, no thyromegaly or nodules  Breasts - breasts appear normal, no suspicious masses, no skin or nipple changes or  axillary nodes  Abdomen - soft, nontender, nondistended, no masses  or organomegaly  Pelvic - VULVA: normal appearing vulva with no masses, tenderness or lesions   VAGINA: normal appearing vagina with normal color and discharge, no lesions   CERVIX: normal appearing cervix without discharge or lesions, no CMT  Thin prep pap is updated today per pt request.  No HR HPV obtained today.    UTERUS: uterus is felt to be normal size, shape, consistency and nontender   ADNEXA: No adnexal masses or tenderness noted.  Rectal - deferred  Extremities:  No swelling or varicosities noted  Consent obtained for IUD removal and replacement.  Cervix cleansed with Betadine x 3.  Singed forceps used to grasp IUD string and IUD removed with one pull and intact.  Visualized by pt prior to discarding.  Single toothed tenaculum applied to the anterior lip of the cervix.  Uterus sounded to 7cm.  IUD and introducer passed to fundus and then  withdrawn slightly.  IUD placed and introducer removed without difficulty.  IUD strings cut to 2cm.  Tenaculum removed.  Minimal bleeding noted.  Pt tolerated procedure well.    Chaperone present for exam  No results found for this or any previous visit (from the past 24 hours).  Assessment & Plan:  1. Well woman exam with routine gynecological exam (Primary) - Pap smear updated today per pt request - Mammogram guidelines reviewed.  Order placed to start after her birthday.   - Colonoscopy guidelines reviewed - lab work done with endocrinologist and PCP - vaccines reviewed/updated.  Second HPV vaccine given.  She will return to complete the third one.   2. Cervical cancer screening - Cytology - PAP( [place])  3. Encounter for screening mammogram for malignant neoplasm of breast - MM 3D SCREENING MAMMOGRAM BILATERAL BREAST; Future  4. Encounter for IUD removal and reinsertion - pt tolerated procedure well.  Kyleena  due for removal and possible replacement no later than 5 years.  IUD card given to pt today.   Orders Placed This Encounter  Procedures   MM 3D SCREENING MAMMOGRAM BILATERAL BREAST    Meds:  Meds ordered this encounter  Medications   levonorgestrel  (KYLEENA ) 19.5 MG IUD    Follow-up: Return in about 6 weeks (around 11/18/2024).  IUD recheck will be done then.    Ronal GORMAN Pinal, MD 10/11/2024 1:59 PM

## 2024-10-09 LAB — CYTOLOGY - PAP: Diagnosis: NEGATIVE

## 2024-10-10 ENCOUNTER — Ambulatory Visit (HOSPITAL_BASED_OUTPATIENT_CLINIC_OR_DEPARTMENT_OTHER): Payer: Self-pay | Admitting: Obstetrics & Gynecology

## 2024-10-11 ENCOUNTER — Encounter (HOSPITAL_BASED_OUTPATIENT_CLINIC_OR_DEPARTMENT_OTHER): Payer: Self-pay | Admitting: Obstetrics & Gynecology

## 2024-10-15 DIAGNOSIS — M0579 Rheumatoid arthritis with rheumatoid factor of multiple sites without organ or systems involvement: Secondary | ICD-10-CM | POA: Diagnosis not present

## 2024-10-15 DIAGNOSIS — F4323 Adjustment disorder with mixed anxiety and depressed mood: Secondary | ICD-10-CM | POA: Diagnosis not present

## 2024-10-19 ENCOUNTER — Encounter (HOSPITAL_BASED_OUTPATIENT_CLINIC_OR_DEPARTMENT_OTHER): Payer: Self-pay

## 2024-10-20 DIAGNOSIS — F4323 Adjustment disorder with mixed anxiety and depressed mood: Secondary | ICD-10-CM | POA: Diagnosis not present

## 2024-10-26 DIAGNOSIS — F4323 Adjustment disorder with mixed anxiety and depressed mood: Secondary | ICD-10-CM | POA: Diagnosis not present

## 2024-10-28 ENCOUNTER — Ambulatory Visit (HOSPITAL_BASED_OUTPATIENT_CLINIC_OR_DEPARTMENT_OTHER)

## 2024-10-28 VITALS — BP 120/79 | HR 79 | Ht 62.0 in | Wt 173.4 lb

## 2024-10-28 DIAGNOSIS — Z23 Encounter for immunization: Secondary | ICD-10-CM

## 2024-10-28 NOTE — Progress Notes (Signed)
 NURSE VISIT- INJECTION  SUBJECTIVE:  Tamara Vega is a 38 y.o. G0P0000 female here for a Gardasil for per provider order. She is a GYN patient.   OBJECTIVE:  BP 120/79 (Patient Position: Sitting, Cuff Size: Small)   Pulse 79   Ht 5' 2 (1.575 m)   Wt 173 lb 6.4 oz (78.7 kg)   LMP 04/13/2024 (Approximate)   BMI 31.72 kg/m   Appears well, in no apparent distress  Injection administered in: Right deltoid  No orders of the defined types were placed in this encounter.   ASSESSMENT: GYN patient Gardasil for per provider order PLAN: Follow-up: as needed   Fay LITTIE Pesa

## 2024-11-05 DIAGNOSIS — F4323 Adjustment disorder with mixed anxiety and depressed mood: Secondary | ICD-10-CM | POA: Diagnosis not present

## 2024-11-12 DIAGNOSIS — M0579 Rheumatoid arthritis with rheumatoid factor of multiple sites without organ or systems involvement: Secondary | ICD-10-CM | POA: Diagnosis not present

## 2024-11-16 DIAGNOSIS — F4323 Adjustment disorder with mixed anxiety and depressed mood: Secondary | ICD-10-CM | POA: Diagnosis not present

## 2024-11-18 ENCOUNTER — Encounter: Payer: Self-pay | Admitting: Family Medicine

## 2024-11-23 ENCOUNTER — Encounter (HOSPITAL_BASED_OUTPATIENT_CLINIC_OR_DEPARTMENT_OTHER): Payer: Self-pay | Admitting: Family Medicine

## 2024-11-23 ENCOUNTER — Encounter (HOSPITAL_BASED_OUTPATIENT_CLINIC_OR_DEPARTMENT_OTHER): Payer: Self-pay | Admitting: Obstetrics & Gynecology

## 2024-11-23 ENCOUNTER — Other Ambulatory Visit (HOSPITAL_BASED_OUTPATIENT_CLINIC_OR_DEPARTMENT_OTHER): Payer: Self-pay | Admitting: Certified Nurse Midwife

## 2024-11-23 MED ORDER — SCOPOLAMINE 1 MG/3DAYS TD PT72: 1.0000 | MEDICATED_PATCH | TRANSDERMAL | 12 refills | Status: DC

## 2024-11-24 MED ORDER — SCOPOLAMINE 1 MG/3DAYS TD PT72: 1.0000 | MEDICATED_PATCH | TRANSDERMAL | 1 refills | Status: AC

## 2024-12-07 ENCOUNTER — Encounter (HOSPITAL_BASED_OUTPATIENT_CLINIC_OR_DEPARTMENT_OTHER): Payer: Self-pay | Admitting: Obstetrics & Gynecology

## 2024-12-07 ENCOUNTER — Ambulatory Visit (HOSPITAL_BASED_OUTPATIENT_CLINIC_OR_DEPARTMENT_OTHER): Admitting: Obstetrics & Gynecology

## 2024-12-07 VITALS — BP 106/70 | HR 69 | Ht 62.0 in | Wt 174.4 lb

## 2024-12-07 DIAGNOSIS — Z30431 Encounter for routine checking of intrauterine contraceptive device: Secondary | ICD-10-CM

## 2024-12-07 DIAGNOSIS — Z975 Presence of (intrauterine) contraceptive device: Secondary | ICD-10-CM

## 2024-12-07 NOTE — Progress Notes (Signed)
 "  CC:  IUD check  39 y.o. G0P0000 Single female presents for followed up after insertion of Mirena  IUD on 11/5/205.  Pt reports she is doing well.  Had minimal spotting after placement.  Just got back from [place] and [place].  Was there for two weeks.  Feeling jet lag today.  Denies any other complaints.  No pelvic pain.    LMP:  No LMP recorded. (Menstrual status: IUD).  Patient Active Problem List   Diagnosis Date Noted   Epistaxis 12/13/2023   Wound of ankle 05/09/2023   Plantar fasciitis, bilateral 11/23/2022   Sinusitis 10/19/2022   Wellness examination 11/20/2021   BMI 31.0-31.9,adult 07/19/2020   Screening for lipid disorders 07/19/2020   Screening for diabetes mellitus 06/14/2020   Thyroid  cancer (HCC) 06/14/2020   IUD (intrauterine device) in place 06/14/2020   Elevated LFTs 06/14/2020   Immunization reaction 06/16/2019   Generalized headaches 10/21/2017   Snoring 10/21/2017   Tachycardia 10/21/2017   Essential hypertension 10/21/2017   Family history of sleep apnea 12/06/2016   Obesity 06/08/2016   Family history of diabetes mellitus 06/07/2016   High risk medication use 06/07/2016   Gastroesophageal reflux disease without esophagitis 06/07/2016   Encounter for health maintenance examination in adult 06/07/2016   Rheumatoid arthritis (HCC) 06/07/2016   Work-related stress 11/25/2015   Generalized anxiety disorder 06/15/2015   Insomnia 06/15/2015   Past Medical History:  Diagnosis Date   Allergy    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 Jon Jacob   Seasonal allergies     Medications Ordered Prior to Encounter[1] Bactrim [sulfamethoxazole-trimethoprim], Diclofenac, and Tdvax [tetanus-diphtheria toxoids td]  Review of Systems  Constitutional: Negative.   Genitourinary: Negative.    Vitals:   12/07/24 1043  BP: 106/70  Pulse: 69  Weight: 174 lb 6.4 oz (79.1 kg)  Height: 5' 2 (1.575 m)    Gen:  WNWF healthy female NAD Abdomen: soft, non-tender Groin:  no inguinal nodes palpated  Pelvic exam: Vulva:  normal female genitalia Vagina:  normal vagina Cervix:  Non-tender, Negative CMT, no lesions or redness.  IUD string 2cm.   Uterus:  normal shape, position and consistency, non tender    Assessment/Plan: 1. IUD check up (Primary) - Follow-up for AEX. - Pt knows to call with any new concerns or problems.     [1]  Current Outpatient Medications on File Prior to Visit  Medication Sig Dispense Refill   ALPRAZolam  (XANAX ) 0.5 MG tablet TAKE 1 TABLET BY MOUTH TWICE A DAY AS NEEDED FOR ANXIETY 60 tablet 0   APPLE CIDER VINEGAR PO Take by mouth.     Ascorbic Acid (VITAMIN C) 100 MG tablet Take 100 mg daily by mouth.     atenolol  (TENORMIN ) 50 MG tablet TAKE 1 TABLET BY MOUTH EVERY DAY 90 tablet 1   Biotin 89999 MCG TBDP Take 5,000 mg by mouth.      Cranberry 1000 MG CAPS Take by mouth.     hydroxychloroquine (PLAQUENIL) 200 MG tablet Take  2 tablets by mouth daily.     levothyroxine  (SYNTHROID ) 100 MCG tablet Take 100 mcg by mouth daily before breakfast.     liothyronine (CYTOMEL) 5 MCG tablet Take 5 mcg by mouth 2 (two) times daily.     losartan  (COZAAR ) 25 MG tablet TAKE 1 TABLET (25 MG TOTAL) BY MOUTH DAILY. 90 tablet 1   RESTASIS  0.05 % ophthalmic emulsion Place 1 drop into both eyes 2 (two) times daily. 1.5 mL 0   scopolamine  (TRANSDERM-SCOP) 1 MG/3DAYS Place 1 patch (1 mg total) onto the skin every 3 (three) days. 10 patch 1   Tocilizumab  (ACTEMRA ) 200 MG/10ML SOLN Inject 1 application into the vein every 28 (twenty-eight) days.      Tocilizumab -aazg (TYENNE ) 80 MG/4ML SOLN Inject 8 mg/kg into the vein once a week.     traZODone  (DESYREL ) 50 MG tablet Take 1 tablet (50 mg total) by mouth at bedtime. 45 tablet 1   vitamin B-12 (CYANOCOBALAMIN) 100 MCG tablet Take 100 mcg daily by mouth.     vortioxetine HBr (TRINTELLIX) 20 MG TABS tablet Take 20 mg by mouth daily.     No current facility-administered medications on file prior to visit.   "

## 2024-12-15 ENCOUNTER — Encounter (HOSPITAL_BASED_OUTPATIENT_CLINIC_OR_DEPARTMENT_OTHER): Payer: Self-pay | Admitting: Family Medicine

## 2024-12-15 ENCOUNTER — Ambulatory Visit (INDEPENDENT_AMBULATORY_CARE_PROVIDER_SITE_OTHER): Payer: BC Managed Care – PPO | Admitting: Family Medicine

## 2024-12-15 VITALS — BP 116/83 | HR 81 | Temp 97.8°F | Resp 18 | Ht 62.0 in | Wt 163.8 lb

## 2024-12-15 DIAGNOSIS — Z0000 Encounter for general adult medical examination without abnormal findings: Secondary | ICD-10-CM

## 2024-12-15 LAB — LIPID PANEL
Chol/HDL Ratio: 2.8 ratio (ref 0.0–4.4)
Cholesterol, Total: 217 mg/dL — ABNORMAL HIGH (ref 100–199)
HDL: 78 mg/dL
LDL Chol Calc (NIH): 129 mg/dL — ABNORMAL HIGH (ref 0–99)
Triglycerides: 56 mg/dL (ref 0–149)
VLDL Cholesterol Cal: 10 mg/dL (ref 5–40)

## 2024-12-15 LAB — HEMOGLOBIN A1C
Est. average glucose Bld gHb Est-mCnc: 100 mg/dL
Hgb A1c MFr Bld: 5.1 % (ref 4.8–5.6)

## 2024-12-15 NOTE — Assessment & Plan Note (Signed)
 Routine HCM labs ordered. HCM reviewed/discussed. Anticipatory guidance regarding healthy weight, lifestyle and choices given. Recommend healthy diet.  Recommend approximately 150 minutes/week of moderate intensity exercise Recommend regular dental and vision exams Always use seatbelt/lap and shoulder restraints Recommend using smoke alarms and checking batteries at least twice a year Recommend using sunscreen when outside Discussed tetanus immunization recommendations, patient is UTD

## 2024-12-15 NOTE — Progress Notes (Signed)
 " Subjective:    CC: Annual Physical Exam  HPI: Tamara Vega is a 39 y.o. presenting for annual physical  I reviewed the past medical history, family history, social history, surgical history, and allergies today and no changes were needed.  Please see the problem list section below in epic for further details.  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 Jon Jacob   Seasonal allergies    Past Surgical History: Past Surgical History:  Procedure Laterality Date   ESOPHAGOGASTRODUODENOSCOPY  01/2010   [place], [place]   TOTAL THYROIDECTOMY  11/02/2021   wisdom teeth removal  06/03/2023   Social History: Social History   Socioeconomic History   Marital status: Single    Spouse name: Not on file   Number of children: Not on file   Years of education: Not on file   Highest education level: Professional school degree (e.g., MD, DDS, DVM, JD)  Occupational History   Not on file  Tobacco Use   Smoking status: Never   Smokeless tobacco: Never  Vaping Use   Vaping status: Never Used  Substance and Sexual Activity   Alcohol use: Not Currently    Alcohol/week: 2.0 standard drinks of alcohol    Types: 2 Standard drinks or equivalent per week   Drug use: Yes    Types: Marijuana   Sexual activity: Not Currently    Birth control/protection: I.U.D.  Other Topics Concern   Not on file  Social History Narrative   Single, attorney, self employed 2019, was formerly at [employer].   Drinks about 3L water daily.   Started [employer] 11/2018.   Joined [employer] [facility] [employer] 12/2018, 4 times per week, doing facebook live zoom workouts, also doing long walks 6-10 mile walks.  No significant other currently. 06/2020   Social Drivers of Health   Tobacco Use: Low Risk (12/15/2024)   Patient History    Smoking Tobacco Use: Never    Smokeless Tobacco Use: Never    Passive Exposure: Not on file  Financial Resource Strain: Low Risk (12/14/2024)   Overall Financial Resource Strain (CARDIA)    Difficulty of Paying Living Expenses: Not hard at all  Food Insecurity: No Food Insecurity (12/14/2024)   Epic    Worried About Programme Researcher, Broadcasting/film/video in the Last Year: Never true    Ran Out of Food in the Last Year: Never true  Transportation Needs: No Transportation Needs (12/14/2024)   Epic    Lack of Transportation (Medical): No    Lack of Transportation (Non-Medical): No  Physical Activity: Sufficiently Active (12/14/2024)   Exercise Vital Sign    Days of Exercise per Week: 7 days    Minutes of Exercise per Session: 50 min  Stress: Stress Concern Present (12/14/2024)   [employer] of Occupational Health - Occupational Stress Questionnaire    Feeling of Stress: Rather much  Social Connections: Moderately Isolated (12/14/2024)   Social Connection  and Isolation Panel    Frequency of Communication with Friends and Family: More than three times a week    Frequency of Social Gatherings with Friends and Family: Once a week    Attends Religious Services: Patient declined    Active Member of Clubs or Organizations: Yes    Attends Banker Meetings: More than 4 times per year    Marital Status: Never married  Depression (PHQ2-9): Low Risk (12/07/2024)   Depression (PHQ2-9)    PHQ-2 Score: 0  Recent Concern: Depression (PHQ2-9) - Medium Risk (10/07/2024)   Depression (PHQ2-9)    PHQ-2 Score: 5  Alcohol Screen: Low Risk (12/14/2024)   Alcohol Screen    Last Alcohol Screening Score (AUDIT): 3  Housing: Low Risk (12/14/2024)    Epic    Unable to Pay for Housing in the Last Year: No    Number of Times Moved in the Last Year: 0    Homeless in the Last Year: No  Utilities: Not At Risk (09/10/2024)   [facility] from [facility]   Epic    In the past 12 months has the electric, gas, oil, or water company threatened to shut off services in your home?: No  Health Literacy: Not on file   Family History: Family History  Problem Relation Age of Onset   Diabetes Father    Hypertension Father    High Cholesterol Father    Obesity Father    Diabetes Mother    Hypertension Mother    High Cholesterol Mother    Arthritis Mother    Diabetes Sister    Hypertension Sister    Obesity Sister    Heart disease Maternal Uncle        MI   Heart disease Maternal Grandmother        MI   Heart disease Maternal Grandfather        MI   Cancer Neg Hx    Allergies: Allergies[1] Medications: See med rec.  Review of Systems: No headache, visual changes, nausea, vomiting, diarrhea, constipation, dizziness, abdominal pain, skin rash, fevers, chills, night sweats, swollen lymph nodes, weight loss, chest pain, body aches, joint swelling, muscle aches, shortness of breath, mood changes, visual or auditory hallucinations.  Objective:    BP (!) 120/93 (BP Location: Right Arm, Patient Position: Sitting, Cuff Size: Normal)   Pulse 85   Temp 97.8 F (36.6 C) (Oral)   Resp 18   Ht 5' 2 (1.575 m)   Wt 163 lb 12.8 oz (74.3 kg)   LMP  (LMP Unknown)   SpO2 95%   BMI 29.96 kg/m   General: Well Developed, well nourished, and in no acute distress.  Neuro: Alert and oriented x3, extra-ocular muscles intact, sensation grossly intact. Cranial nerves II through XII are intact, motor, sensory, and coordinative functions are all intact. HEENT: Normocephalic, atraumatic, pupils equal round reactive to light, neck supple, no masses, no lymphadenopathy, thyroid  nonpalpable. Oropharynx, nasopharynx, external ear canals are  unremarkable. Skin: Warm and dry, no rashes noted.  Cardiac: Regular rate and rhythm, no murmurs rubs or gallops.  Respiratory: Clear to auscultation bilaterally. Not using accessory muscles, speaking in full sentences.  Abdominal: Soft, nontender, nondistended, positive bowel sounds, no masses, no organomegaly.  Musculoskeletal: Shoulder, elbow, wrist, hip, knee, ankle stable, and with full range of motion.  Impression and Recommendations:    Wellness examination Assessment & Plan: Routine HCM labs ordered. HCM reviewed/discussed. Anticipatory guidance regarding healthy weight, lifestyle and choices given.  Recommend healthy diet.  Recommend approximately 150 minutes/week of moderate intensity exercise Recommend regular dental and vision exams Always use seatbelt/lap and shoulder restraints Recommend using smoke alarms and checking batteries at least twice a year Recommend using sunscreen when outside Discussed tetanus immunization recommendations, patient is UTD  Orders: -     Hemoglobin A1c -     Lipid panel  Return in about 1 year (around 12/15/2025) for CPE.   ___________________________________________ Jasun Gasparini de [place], MD, ABFM, CAQSM Primary Care and Sports Medicine [facility]    [1]  Allergies Allergen Reactions   Bactrim [Sulfamethoxazole-Trimethoprim] Swelling    reddness and itching    Diclofenac     Blood in stool once 2016   Tdvax [Tetanus-Diphtheria Toxoids Td]     Td vaccine - swollen red painful injection site, fatigue, headache   "

## 2024-12-16 ENCOUNTER — Ambulatory Visit (HOSPITAL_BASED_OUTPATIENT_CLINIC_OR_DEPARTMENT_OTHER): Payer: Self-pay | Admitting: Family Medicine

## 2025-12-17 ENCOUNTER — Encounter (HOSPITAL_BASED_OUTPATIENT_CLINIC_OR_DEPARTMENT_OTHER): Admitting: Family Medicine
